# Patient Record
Sex: Female | Born: 1955
Health system: Southern US, Community
[De-identification: ages and names within clinical notes are randomized; demographics above are authoritative.]

## PROBLEM LIST (undated history)

## (undated) DIAGNOSIS — Z9889 Other specified postprocedural states: Secondary | ICD-10-CM

## (undated) DIAGNOSIS — E785 Hyperlipidemia, unspecified: Secondary | ICD-10-CM

## (undated) DIAGNOSIS — T8859XA Other complications of anesthesia, initial encounter: Secondary | ICD-10-CM

## (undated) DIAGNOSIS — E039 Hypothyroidism, unspecified: Secondary | ICD-10-CM

## (undated) DIAGNOSIS — K219 Gastro-esophageal reflux disease without esophagitis: Secondary | ICD-10-CM

## (undated) DIAGNOSIS — D242 Benign neoplasm of left breast: Secondary | ICD-10-CM

## (undated) HISTORY — PX: COLONOSCOPY: SHX174

## (undated) HISTORY — PX: LAPAROSCOPY FOR ECTOPIC PREGNANCY: SUR765

---

## 1999-08-09 ENCOUNTER — Encounter: Admission: RE | Admit: 1999-08-09 | Discharge: 1999-08-09 | Payer: Self-pay | Admitting: General Practice

## 1999-08-09 ENCOUNTER — Encounter: Payer: Self-pay | Admitting: General Practice

## 1999-08-23 ENCOUNTER — Other Ambulatory Visit: Admission: RE | Admit: 1999-08-23 | Discharge: 1999-08-23 | Payer: Self-pay | Admitting: Gynecology

## 1999-10-17 ENCOUNTER — Encounter: Admission: RE | Admit: 1999-10-17 | Discharge: 1999-10-17 | Payer: Self-pay | Admitting: Gynecology

## 1999-10-17 ENCOUNTER — Encounter: Payer: Self-pay | Admitting: Gynecology

## 1999-10-20 ENCOUNTER — Encounter: Admission: RE | Admit: 1999-10-20 | Discharge: 1999-10-20 | Payer: Self-pay | Admitting: Gynecology

## 1999-10-20 ENCOUNTER — Encounter: Payer: Self-pay | Admitting: Gynecology

## 2000-08-28 ENCOUNTER — Other Ambulatory Visit: Admission: RE | Admit: 2000-08-28 | Discharge: 2000-08-28 | Payer: Self-pay | Admitting: Gynecology

## 2000-10-21 ENCOUNTER — Encounter: Admission: RE | Admit: 2000-10-21 | Discharge: 2000-10-21 | Payer: Self-pay | Admitting: Gynecology

## 2000-10-21 ENCOUNTER — Encounter: Payer: Self-pay | Admitting: Gynecology

## 2000-10-23 ENCOUNTER — Encounter: Payer: Self-pay | Admitting: Gynecology

## 2000-10-23 ENCOUNTER — Encounter: Admission: RE | Admit: 2000-10-23 | Discharge: 2000-10-23 | Payer: Self-pay | Admitting: Gynecology

## 2001-10-15 ENCOUNTER — Other Ambulatory Visit: Admission: RE | Admit: 2001-10-15 | Discharge: 2001-10-15 | Payer: Self-pay | Admitting: Gynecology

## 2001-10-29 ENCOUNTER — Encounter: Payer: Self-pay | Admitting: Gynecology

## 2001-10-29 ENCOUNTER — Encounter: Admission: RE | Admit: 2001-10-29 | Discharge: 2001-10-29 | Payer: Self-pay | Admitting: Gynecology

## 2002-10-19 ENCOUNTER — Other Ambulatory Visit: Admission: RE | Admit: 2002-10-19 | Discharge: 2002-10-19 | Payer: Self-pay | Admitting: Gynecology

## 2002-10-30 ENCOUNTER — Encounter: Payer: Self-pay | Admitting: Gynecology

## 2002-10-30 ENCOUNTER — Encounter: Admission: RE | Admit: 2002-10-30 | Discharge: 2002-10-30 | Payer: Self-pay | Admitting: Gynecology

## 2003-12-27 ENCOUNTER — Encounter: Admission: RE | Admit: 2003-12-27 | Discharge: 2003-12-27 | Payer: Self-pay | Admitting: Gynecology

## 2004-01-05 ENCOUNTER — Other Ambulatory Visit: Admission: RE | Admit: 2004-01-05 | Discharge: 2004-01-05 | Payer: Self-pay | Admitting: Gynecology

## 2004-01-19 ENCOUNTER — Encounter: Admission: RE | Admit: 2004-01-19 | Discharge: 2004-01-19 | Payer: Self-pay | Admitting: Gynecology

## 2004-02-04 ENCOUNTER — Ambulatory Visit (HOSPITAL_COMMUNITY): Admission: RE | Admit: 2004-02-04 | Discharge: 2004-02-04 | Payer: Self-pay | Admitting: Unknown Physician Specialty

## 2004-08-28 ENCOUNTER — Ambulatory Visit (HOSPITAL_COMMUNITY): Admission: RE | Admit: 2004-08-28 | Discharge: 2004-08-28 | Payer: Self-pay | Admitting: Gastroenterology

## 2004-09-01 ENCOUNTER — Ambulatory Visit: Payer: Self-pay | Admitting: Family Medicine

## 2004-09-22 ENCOUNTER — Ambulatory Visit: Payer: Self-pay | Admitting: Family Medicine

## 2004-12-13 ENCOUNTER — Ambulatory Visit: Payer: Self-pay | Admitting: Family Medicine

## 2005-01-04 ENCOUNTER — Other Ambulatory Visit: Admission: RE | Admit: 2005-01-04 | Discharge: 2005-01-04 | Payer: Self-pay | Admitting: Gynecology

## 2005-01-09 ENCOUNTER — Ambulatory Visit (HOSPITAL_COMMUNITY): Admission: RE | Admit: 2005-01-09 | Discharge: 2005-01-09 | Payer: Self-pay | Admitting: Gynecology

## 2005-01-19 ENCOUNTER — Ambulatory Visit: Payer: Self-pay | Admitting: Family Medicine

## 2005-02-05 ENCOUNTER — Ambulatory Visit: Payer: Self-pay | Admitting: Cardiology

## 2006-06-19 ENCOUNTER — Ambulatory Visit (HOSPITAL_COMMUNITY): Admission: RE | Admit: 2006-06-19 | Discharge: 2006-06-19 | Payer: Self-pay | Admitting: Gynecology

## 2006-08-14 ENCOUNTER — Other Ambulatory Visit: Admission: RE | Admit: 2006-08-14 | Discharge: 2006-08-14 | Payer: Self-pay | Admitting: Gynecology

## 2007-07-21 ENCOUNTER — Other Ambulatory Visit: Admission: RE | Admit: 2007-07-21 | Discharge: 2007-07-21 | Payer: Self-pay | Admitting: Gynecology

## 2007-07-30 ENCOUNTER — Ambulatory Visit (HOSPITAL_COMMUNITY): Admission: RE | Admit: 2007-07-30 | Discharge: 2007-07-30 | Payer: Self-pay | Admitting: Gynecology

## 2008-06-17 ENCOUNTER — Other Ambulatory Visit: Admission: RE | Admit: 2008-06-17 | Discharge: 2008-06-17 | Payer: Self-pay | Admitting: Gynecology

## 2008-08-09 ENCOUNTER — Ambulatory Visit (HOSPITAL_COMMUNITY): Admission: RE | Admit: 2008-08-09 | Discharge: 2008-08-09 | Payer: Self-pay | Admitting: Gynecology

## 2009-07-12 ENCOUNTER — Ambulatory Visit (HOSPITAL_COMMUNITY): Admission: RE | Admit: 2009-07-12 | Discharge: 2009-07-12 | Payer: Self-pay | Admitting: Gynecology

## 2010-10-29 ENCOUNTER — Encounter: Payer: Self-pay | Admitting: Gynecology

## 2011-02-23 NOTE — Op Note (Signed)
NAMELILLYTH, [name]              ACCOUNT NO.:  [id number]   MEDICAL RECORD NO.:  [id number]          PATIENT TYPE:  AMB   LOCATION:  ENDO                         FACILITY:  [facility]   PHYSICIAN:  Anselmo Rod, M.D.  DATE OF BIRTH:  11-12-1955   DATE OF PROCEDURE:  08/28/2004  DATE OF DISCHARGE:                                 OPERATIVE REPORT   PROCEDURE PERFORMED:  Screening colonoscopy.   ENDOSCOPIST:  Charna Elizabeth, M.D.   INSTRUMENT USED:  Olympus video colonoscope.   INDICATIONS FOR PROCEDURE:  The patient is a 55 year old white female  undergoing screening colonoscopy to rule out colonic polyps, masses, etc.  The patient's mother was found to have a mass at the hepatic flexure, a  question premalignant lesion.   PREPROCEDURE PREPARATION:  Informed consent was procured from the patient.  The patient was fasted for eight hours prior to the procedure and prepped  with a bottle of magnesium citrate and a gallon of GoLYTELY the night prior  to the procedure.  The risks and benefits of the procedure including a 10%  miss rate for polyps and cancers was discussed with the patient in great  detail and the patient was fasted overnight prior to the colonoscopy.   PREPROCEDURE PHYSICAL:  The patient had stable vital signs.  Neck supple.  Chest clear to auscultation.  S1 and S2 regular.  Abdomen soft with normal  bowel sounds.   DESCRIPTION OF PROCEDURE:  The patient was placed in left lateral decubitus  position and sedated with 59 mg of Demerol and 5 mg of Versed in slow  incremental doses.  Once the patient was adequately sedated and maintained  on low flow oxygen and continuous cardiac monitoring, the Olympus video  colonoscope was advanced from the rectum to the cecum.  The appendicular  orifice and ileocecal valve were clearly visualized and photographed.  Small  internal hemorrhoids were seen on retroflexion.  No other abnormalities were  noted.  The patient tolerated the  procedure well without complications.   IMPRESSION:  Normal colonoscopy up to the cecum except for small internal  hemorrhoids seen on retroflexion.   RECOMMENDATIONS:  1.  Continue high fiber diet with liberal fluid intake.  2.  Repeat colonoscopy is recommended in the next five years unless the      patient develops any abnormal symptoms in the interim.  3.  Outpatient followup as need arises in the future.       JNM/MEDQ  D:  08/28/2004  T:  08/28/2004  Job:  [id number]   cc:   Sharlet Salina, M.D.  [address]  [place]  [place] [address]  Fax: [phone]

## 2011-10-26 ENCOUNTER — Other Ambulatory Visit: Payer: Self-pay | Admitting: Obstetrics & Gynecology

## 2014-01-21 ENCOUNTER — Other Ambulatory Visit: Payer: Self-pay | Admitting: Dermatology

## 2014-07-26 ENCOUNTER — Other Ambulatory Visit: Payer: Self-pay

## 2014-07-27 LAB — CYTOLOGY - PAP

## 2014-08-12 ENCOUNTER — Other Ambulatory Visit: Payer: Self-pay | Admitting: Gynecology

## 2014-08-12 DIAGNOSIS — R928 Other abnormal and inconclusive findings on diagnostic imaging of breast: Secondary | ICD-10-CM

## 2014-08-19 ENCOUNTER — Ambulatory Visit
Admission: RE | Admit: 2014-08-19 | Discharge: 2014-08-19 | Disposition: A | Discharge status: Home / Self Care | Payer: 59 | Source: Ambulatory Visit | Attending: Gynecology | Admitting: Gynecology

## 2014-08-19 DIAGNOSIS — R928 Other abnormal and inconclusive findings on diagnostic imaging of breast: Secondary | ICD-10-CM

## 2015-01-17 ENCOUNTER — Other Ambulatory Visit: Payer: Self-pay | Admitting: Gynecology

## 2015-01-17 DIAGNOSIS — N6001 Solitary cyst of right breast: Secondary | ICD-10-CM

## 2015-02-24 ENCOUNTER — Ambulatory Visit
Admission: RE | Admit: 2015-02-24 | Discharge: 2015-02-24 | Disposition: A | Discharge status: Home / Self Care | Payer: 59 | Source: Ambulatory Visit | Attending: Gynecology | Admitting: Gynecology

## 2015-02-24 ENCOUNTER — Other Ambulatory Visit: Payer: Self-pay

## 2015-02-24 ENCOUNTER — Encounter (INDEPENDENT_AMBULATORY_CARE_PROVIDER_SITE_OTHER): Payer: Self-pay

## 2015-02-24 DIAGNOSIS — N6001 Solitary cyst of right breast: Secondary | ICD-10-CM

## 2015-08-03 ENCOUNTER — Other Ambulatory Visit: Payer: Self-pay

## 2015-08-03 DIAGNOSIS — Z1231 Encounter for screening mammogram for malignant neoplasm of breast: Secondary | ICD-10-CM

## 2015-08-23 ENCOUNTER — Ambulatory Visit
Admission: RE | Admit: 2015-08-23 | Discharge: 2015-08-23 | Disposition: A | Discharge status: Home / Self Care | Payer: 59 | Source: Ambulatory Visit

## 2015-08-23 DIAGNOSIS — Z1231 Encounter for screening mammogram for malignant neoplasm of breast: Secondary | ICD-10-CM

## 2016-06-22 ENCOUNTER — Other Ambulatory Visit: Payer: Self-pay | Admitting: Obstetrics and Gynecology

## 2016-06-22 DIAGNOSIS — Z1231 Encounter for screening mammogram for malignant neoplasm of breast: Secondary | ICD-10-CM

## 2016-08-23 ENCOUNTER — Ambulatory Visit
Admission: RE | Admit: 2016-08-23 | Discharge: 2016-08-23 | Disposition: A | Discharge status: Home / Self Care | Payer: 59 | Source: Ambulatory Visit | Attending: Obstetrics and Gynecology | Admitting: Obstetrics and Gynecology

## 2016-08-23 DIAGNOSIS — Z1231 Encounter for screening mammogram for malignant neoplasm of breast: Secondary | ICD-10-CM

## 2016-10-30 DIAGNOSIS — E7800 Pure hypercholesterolemia, unspecified: Secondary | ICD-10-CM | POA: Diagnosis not present

## 2016-10-30 DIAGNOSIS — Z0000 Encounter for general adult medical examination without abnormal findings: Secondary | ICD-10-CM | POA: Diagnosis not present

## 2016-12-11 DIAGNOSIS — D1800 Hemangioma unspecified site: Secondary | ICD-10-CM | POA: Diagnosis not present

## 2016-12-11 DIAGNOSIS — L821 Other seborrheic keratosis: Secondary | ICD-10-CM | POA: Diagnosis not present

## 2016-12-28 DIAGNOSIS — E559 Vitamin D deficiency, unspecified: Secondary | ICD-10-CM | POA: Diagnosis not present

## 2017-01-21 DIAGNOSIS — S30871A Other superficial bite of abdominal wall, initial encounter: Secondary | ICD-10-CM | POA: Diagnosis not present

## 2017-07-24 ENCOUNTER — Other Ambulatory Visit: Payer: Self-pay | Admitting: Family Medicine

## 2017-07-24 DIAGNOSIS — Z1231 Encounter for screening mammogram for malignant neoplasm of breast: Secondary | ICD-10-CM

## 2017-08-26 ENCOUNTER — Ambulatory Visit
Admission: RE | Admit: 2017-08-26 | Discharge: 2017-08-26 | Disposition: A | Discharge status: Home / Self Care | Payer: 59 | Source: Ambulatory Visit | Attending: Family Medicine | Admitting: Family Medicine

## 2017-08-26 DIAGNOSIS — Z1231 Encounter for screening mammogram for malignant neoplasm of breast: Secondary | ICD-10-CM | POA: Diagnosis not present

## 2017-08-27 DIAGNOSIS — K219 Gastro-esophageal reflux disease without esophagitis: Secondary | ICD-10-CM | POA: Diagnosis not present

## 2017-08-27 DIAGNOSIS — K625 Hemorrhage of anus and rectum: Secondary | ICD-10-CM | POA: Diagnosis not present

## 2017-08-27 DIAGNOSIS — K602 Anal fissure, unspecified: Secondary | ICD-10-CM | POA: Diagnosis not present

## 2017-10-07 DIAGNOSIS — R300 Dysuria: Secondary | ICD-10-CM | POA: Diagnosis not present

## 2017-10-07 DIAGNOSIS — R319 Hematuria, unspecified: Secondary | ICD-10-CM | POA: Diagnosis not present

## 2017-10-07 DIAGNOSIS — N390 Urinary tract infection, site not specified: Secondary | ICD-10-CM | POA: Diagnosis not present

## 2017-10-21 DIAGNOSIS — N390 Urinary tract infection, site not specified: Secondary | ICD-10-CM | POA: Diagnosis not present

## 2017-11-01 DIAGNOSIS — R82998 Other abnormal findings in urine: Secondary | ICD-10-CM | POA: Diagnosis not present

## 2017-11-01 DIAGNOSIS — N390 Urinary tract infection, site not specified: Secondary | ICD-10-CM | POA: Diagnosis not present

## 2017-11-05 DIAGNOSIS — E7800 Pure hypercholesterolemia, unspecified: Secondary | ICD-10-CM | POA: Diagnosis not present

## 2017-11-05 DIAGNOSIS — E559 Vitamin D deficiency, unspecified: Secondary | ICD-10-CM | POA: Diagnosis not present

## 2017-11-05 DIAGNOSIS — Z0000 Encounter for general adult medical examination without abnormal findings: Secondary | ICD-10-CM | POA: Diagnosis not present

## 2017-11-26 DIAGNOSIS — N3020 Other chronic cystitis without hematuria: Secondary | ICD-10-CM | POA: Diagnosis not present

## 2017-12-02 ENCOUNTER — Ambulatory Visit
Admission: RE | Admit: 2017-12-02 | Discharge: 2017-12-02 | Disposition: A | Discharge status: Home / Self Care | Payer: 59 | Source: Ambulatory Visit | Attending: Family Medicine | Admitting: Family Medicine

## 2017-12-02 ENCOUNTER — Other Ambulatory Visit: Payer: Self-pay | Admitting: Family Medicine

## 2017-12-02 DIAGNOSIS — M25562 Pain in left knee: Principal | ICD-10-CM

## 2017-12-02 DIAGNOSIS — M25561 Pain in right knee: Secondary | ICD-10-CM

## 2017-12-02 DIAGNOSIS — M25461 Effusion, right knee: Secondary | ICD-10-CM | POA: Diagnosis not present

## 2017-12-12 DIAGNOSIS — Z800 Family history of malignant neoplasm of digestive organs: Secondary | ICD-10-CM | POA: Diagnosis not present

## 2017-12-12 DIAGNOSIS — D1800 Hemangioma unspecified site: Secondary | ICD-10-CM | POA: Diagnosis not present

## 2017-12-12 DIAGNOSIS — L821 Other seborrheic keratosis: Secondary | ICD-10-CM | POA: Diagnosis not present

## 2018-01-09 DIAGNOSIS — Z6831 Body mass index (BMI) 31.0-31.9, adult: Secondary | ICD-10-CM | POA: Diagnosis not present

## 2018-01-09 DIAGNOSIS — Z01419 Encounter for gynecological examination (general) (routine) without abnormal findings: Secondary | ICD-10-CM | POA: Diagnosis not present

## 2018-01-15 DIAGNOSIS — R9389 Abnormal findings on diagnostic imaging of other specified body structures: Secondary | ICD-10-CM | POA: Diagnosis not present

## 2018-01-15 DIAGNOSIS — N950 Postmenopausal bleeding: Secondary | ICD-10-CM | POA: Diagnosis not present

## 2018-02-27 DIAGNOSIS — N840 Polyp of corpus uteri: Secondary | ICD-10-CM | POA: Diagnosis not present

## 2018-02-27 DIAGNOSIS — N950 Postmenopausal bleeding: Secondary | ICD-10-CM | POA: Diagnosis not present

## 2018-02-27 DIAGNOSIS — N858 Other specified noninflammatory disorders of uterus: Secondary | ICD-10-CM | POA: Diagnosis not present

## 2018-04-18 DIAGNOSIS — R5382 Chronic fatigue, unspecified: Secondary | ICD-10-CM | POA: Diagnosis not present

## 2018-04-18 DIAGNOSIS — R05 Cough: Secondary | ICD-10-CM | POA: Diagnosis not present

## 2018-04-18 DIAGNOSIS — E559 Vitamin D deficiency, unspecified: Secondary | ICD-10-CM | POA: Diagnosis not present

## 2018-04-18 DIAGNOSIS — B029 Zoster without complications: Secondary | ICD-10-CM | POA: Diagnosis not present

## 2018-04-18 DIAGNOSIS — E538 Deficiency of other specified B group vitamins: Secondary | ICD-10-CM | POA: Diagnosis not present

## 2018-04-22 ENCOUNTER — Ambulatory Visit
Admission: RE | Admit: 2018-04-22 | Discharge: 2018-04-22 | Disposition: A | Discharge status: Home / Self Care | Payer: 59 | Source: Ambulatory Visit | Attending: Family Medicine | Admitting: Family Medicine

## 2018-04-22 ENCOUNTER — Other Ambulatory Visit: Payer: Self-pay | Admitting: Family Medicine

## 2018-04-22 DIAGNOSIS — R05 Cough: Secondary | ICD-10-CM

## 2018-04-22 DIAGNOSIS — R059 Cough, unspecified: Secondary | ICD-10-CM

## 2018-04-22 DIAGNOSIS — R109 Unspecified abdominal pain: Secondary | ICD-10-CM | POA: Diagnosis not present

## 2018-06-18 DIAGNOSIS — M79671 Pain in right foot: Secondary | ICD-10-CM | POA: Diagnosis not present

## 2018-06-18 DIAGNOSIS — Z23 Encounter for immunization: Secondary | ICD-10-CM | POA: Diagnosis not present

## 2018-06-18 DIAGNOSIS — E039 Hypothyroidism, unspecified: Secondary | ICD-10-CM | POA: Diagnosis not present

## 2018-06-19 ENCOUNTER — Other Ambulatory Visit: Payer: Self-pay | Admitting: Family Medicine

## 2018-06-19 ENCOUNTER — Ambulatory Visit
Admission: RE | Admit: 2018-06-19 | Discharge: 2018-06-19 | Disposition: A | Discharge status: Home / Self Care | Payer: 59 | Source: Ambulatory Visit | Attending: Family Medicine | Admitting: Family Medicine

## 2018-06-19 DIAGNOSIS — M79671 Pain in right foot: Secondary | ICD-10-CM

## 2018-06-19 DIAGNOSIS — S99921A Unspecified injury of right foot, initial encounter: Secondary | ICD-10-CM | POA: Diagnosis not present

## 2018-08-27 ENCOUNTER — Other Ambulatory Visit: Payer: Self-pay | Admitting: Family Medicine

## 2018-08-27 DIAGNOSIS — Z1231 Encounter for screening mammogram for malignant neoplasm of breast: Secondary | ICD-10-CM

## 2018-09-09 ENCOUNTER — Ambulatory Visit
Admission: RE | Admit: 2018-09-09 | Discharge: 2018-09-09 | Disposition: A | Discharge status: Home / Self Care | Payer: 59 | Source: Ambulatory Visit | Attending: Family Medicine | Admitting: Family Medicine

## 2018-09-09 DIAGNOSIS — Z1231 Encounter for screening mammogram for malignant neoplasm of breast: Secondary | ICD-10-CM | POA: Diagnosis not present

## 2018-11-25 DIAGNOSIS — E7800 Pure hypercholesterolemia, unspecified: Secondary | ICD-10-CM | POA: Diagnosis not present

## 2018-11-25 DIAGNOSIS — Z5181 Encounter for therapeutic drug level monitoring: Secondary | ICD-10-CM | POA: Diagnosis not present

## 2018-11-25 DIAGNOSIS — E559 Vitamin D deficiency, unspecified: Secondary | ICD-10-CM | POA: Diagnosis not present

## 2018-11-25 DIAGNOSIS — E039 Hypothyroidism, unspecified: Secondary | ICD-10-CM | POA: Diagnosis not present

## 2018-12-05 DIAGNOSIS — J069 Acute upper respiratory infection, unspecified: Secondary | ICD-10-CM | POA: Diagnosis not present

## 2018-12-10 DIAGNOSIS — Z0000 Encounter for general adult medical examination without abnormal findings: Secondary | ICD-10-CM | POA: Diagnosis not present

## 2018-12-22 DIAGNOSIS — Z1211 Encounter for screening for malignant neoplasm of colon: Secondary | ICD-10-CM | POA: Diagnosis not present

## 2019-08-31 DIAGNOSIS — L0231 Cutaneous abscess of buttock: Secondary | ICD-10-CM | POA: Diagnosis not present

## 2019-09-07 DIAGNOSIS — L723 Sebaceous cyst: Secondary | ICD-10-CM | POA: Diagnosis not present

## 2019-11-03 ENCOUNTER — Other Ambulatory Visit: Payer: Self-pay | Admitting: Family Medicine

## 2019-11-03 DIAGNOSIS — Z1231 Encounter for screening mammogram for malignant neoplasm of breast: Secondary | ICD-10-CM

## 2019-11-09 ENCOUNTER — Other Ambulatory Visit: Payer: 59

## 2019-12-09 ENCOUNTER — Ambulatory Visit: Payer: 59

## 2019-12-28 ENCOUNTER — Other Ambulatory Visit: Payer: Self-pay

## 2019-12-28 ENCOUNTER — Ambulatory Visit
Admission: RE | Admit: 2019-12-28 | Discharge: 2019-12-28 | Disposition: A | Discharge status: Home / Self Care | Payer: Medicare HMO | Source: Ambulatory Visit | Attending: Family Medicine | Admitting: Family Medicine

## 2019-12-28 DIAGNOSIS — Z1231 Encounter for screening mammogram for malignant neoplasm of breast: Secondary | ICD-10-CM

## 2019-12-30 ENCOUNTER — Other Ambulatory Visit: Payer: Self-pay | Admitting: Family Medicine

## 2019-12-30 DIAGNOSIS — R928 Other abnormal and inconclusive findings on diagnostic imaging of breast: Secondary | ICD-10-CM

## 2020-01-01 ENCOUNTER — Other Ambulatory Visit: Payer: Self-pay

## 2020-01-01 ENCOUNTER — Ambulatory Visit
Admission: RE | Admit: 2020-01-01 | Discharge: 2020-01-01 | Disposition: A | Discharge status: Home / Self Care | Payer: Medicare HMO | Source: Ambulatory Visit | Attending: Family Medicine | Admitting: Family Medicine

## 2020-01-01 DIAGNOSIS — N6324 Unspecified lump in the left breast, lower inner quadrant: Secondary | ICD-10-CM | POA: Diagnosis not present

## 2020-01-01 DIAGNOSIS — R928 Other abnormal and inconclusive findings on diagnostic imaging of breast: Secondary | ICD-10-CM | POA: Diagnosis not present

## 2020-01-20 DIAGNOSIS — E7800 Pure hypercholesterolemia, unspecified: Secondary | ICD-10-CM | POA: Diagnosis not present

## 2020-01-20 DIAGNOSIS — E039 Hypothyroidism, unspecified: Secondary | ICD-10-CM | POA: Diagnosis not present

## 2020-01-20 DIAGNOSIS — Z5181 Encounter for therapeutic drug level monitoring: Secondary | ICD-10-CM | POA: Diagnosis not present

## 2020-01-27 DIAGNOSIS — Z0000 Encounter for general adult medical examination without abnormal findings: Secondary | ICD-10-CM | POA: Diagnosis not present

## 2020-01-27 DIAGNOSIS — E039 Hypothyroidism, unspecified: Secondary | ICD-10-CM | POA: Diagnosis not present

## 2020-01-27 DIAGNOSIS — E7800 Pure hypercholesterolemia, unspecified: Secondary | ICD-10-CM | POA: Diagnosis not present

## 2020-01-27 DIAGNOSIS — Z5181 Encounter for therapeutic drug level monitoring: Secondary | ICD-10-CM | POA: Diagnosis not present

## 2020-01-27 DIAGNOSIS — M1990 Unspecified osteoarthritis, unspecified site: Secondary | ICD-10-CM | POA: Diagnosis not present

## 2020-01-27 DIAGNOSIS — Z23 Encounter for immunization: Secondary | ICD-10-CM | POA: Diagnosis not present

## 2020-02-16 DIAGNOSIS — M549 Dorsalgia, unspecified: Secondary | ICD-10-CM | POA: Diagnosis not present

## 2020-02-16 DIAGNOSIS — M545 Low back pain: Secondary | ICD-10-CM | POA: Diagnosis not present

## 2020-02-16 DIAGNOSIS — D8989 Other specified disorders involving the immune mechanism, not elsewhere classified: Secondary | ICD-10-CM | POA: Diagnosis not present

## 2020-02-16 DIAGNOSIS — R768 Other specified abnormal immunological findings in serum: Secondary | ICD-10-CM | POA: Diagnosis not present

## 2020-02-16 DIAGNOSIS — M79643 Pain in unspecified hand: Secondary | ICD-10-CM | POA: Diagnosis not present

## 2020-02-16 DIAGNOSIS — E785 Hyperlipidemia, unspecified: Secondary | ICD-10-CM | POA: Diagnosis not present

## 2020-02-16 DIAGNOSIS — M359 Systemic involvement of connective tissue, unspecified: Secondary | ICD-10-CM | POA: Diagnosis not present

## 2020-02-16 DIAGNOSIS — M79673 Pain in unspecified foot: Secondary | ICD-10-CM | POA: Diagnosis not present

## 2020-02-16 DIAGNOSIS — E039 Hypothyroidism, unspecified: Secondary | ICD-10-CM | POA: Diagnosis not present

## 2020-02-16 DIAGNOSIS — M1990 Unspecified osteoarthritis, unspecified site: Secondary | ICD-10-CM | POA: Diagnosis not present

## 2020-03-10 DIAGNOSIS — J0100 Acute maxillary sinusitis, unspecified: Secondary | ICD-10-CM | POA: Diagnosis not present

## 2020-03-10 DIAGNOSIS — R05 Cough: Secondary | ICD-10-CM | POA: Diagnosis not present

## 2020-04-18 DIAGNOSIS — E039 Hypothyroidism, unspecified: Secondary | ICD-10-CM | POA: Diagnosis not present

## 2020-05-16 DIAGNOSIS — Z7189 Other specified counseling: Secondary | ICD-10-CM | POA: Diagnosis not present

## 2020-05-16 DIAGNOSIS — E063 Autoimmune thyroiditis: Secondary | ICD-10-CM | POA: Diagnosis not present

## 2020-05-16 DIAGNOSIS — E039 Hypothyroidism, unspecified: Secondary | ICD-10-CM | POA: Diagnosis not present

## 2020-06-28 DIAGNOSIS — E039 Hypothyroidism, unspecified: Secondary | ICD-10-CM | POA: Diagnosis not present

## 2020-06-28 DIAGNOSIS — Z23 Encounter for immunization: Secondary | ICD-10-CM | POA: Diagnosis not present

## 2020-07-07 DIAGNOSIS — B009 Herpesviral infection, unspecified: Secondary | ICD-10-CM | POA: Diagnosis not present

## 2020-07-07 DIAGNOSIS — Z6831 Body mass index (BMI) 31.0-31.9, adult: Secondary | ICD-10-CM | POA: Diagnosis not present

## 2020-07-07 DIAGNOSIS — Z01419 Encounter for gynecological examination (general) (routine) without abnormal findings: Secondary | ICD-10-CM | POA: Diagnosis not present

## 2020-07-07 DIAGNOSIS — L292 Pruritus vulvae: Secondary | ICD-10-CM | POA: Diagnosis not present

## 2020-07-13 DIAGNOSIS — R309 Painful micturition, unspecified: Secondary | ICD-10-CM | POA: Diagnosis not present

## 2020-07-13 DIAGNOSIS — N390 Urinary tract infection, site not specified: Secondary | ICD-10-CM | POA: Diagnosis not present

## 2020-08-08 DIAGNOSIS — Z03818 Encounter for observation for suspected exposure to other biological agents ruled out: Secondary | ICD-10-CM | POA: Diagnosis not present

## 2020-08-08 DIAGNOSIS — R0981 Nasal congestion: Secondary | ICD-10-CM | POA: Diagnosis not present

## 2020-08-22 DIAGNOSIS — E039 Hypothyroidism, unspecified: Secondary | ICD-10-CM | POA: Diagnosis not present

## 2020-08-22 DIAGNOSIS — R682 Dry mouth, unspecified: Secondary | ICD-10-CM | POA: Diagnosis not present

## 2020-08-22 DIAGNOSIS — E063 Autoimmune thyroiditis: Secondary | ICD-10-CM | POA: Diagnosis not present

## 2020-11-08 DIAGNOSIS — H43811 Vitreous degeneration, right eye: Secondary | ICD-10-CM | POA: Diagnosis not present

## 2020-11-25 DIAGNOSIS — H524 Presbyopia: Secondary | ICD-10-CM | POA: Diagnosis not present

## 2021-01-10 DIAGNOSIS — L578 Other skin changes due to chronic exposure to nonionizing radiation: Secondary | ICD-10-CM | POA: Diagnosis not present

## 2021-01-10 DIAGNOSIS — L300 Nummular dermatitis: Secondary | ICD-10-CM | POA: Diagnosis not present

## 2021-01-10 DIAGNOSIS — L723 Sebaceous cyst: Secondary | ICD-10-CM | POA: Diagnosis not present

## 2021-01-10 DIAGNOSIS — L821 Other seborrheic keratosis: Secondary | ICD-10-CM | POA: Diagnosis not present

## 2021-01-10 DIAGNOSIS — L570 Actinic keratosis: Secondary | ICD-10-CM | POA: Diagnosis not present

## 2021-01-11 ENCOUNTER — Other Ambulatory Visit: Payer: Self-pay | Admitting: Family Medicine

## 2021-01-11 DIAGNOSIS — Z1231 Encounter for screening mammogram for malignant neoplasm of breast: Secondary | ICD-10-CM

## 2021-01-18 DIAGNOSIS — M5033 Other cervical disc degeneration, cervicothoracic region: Secondary | ICD-10-CM | POA: Diagnosis not present

## 2021-01-18 DIAGNOSIS — M9901 Segmental and somatic dysfunction of cervical region: Secondary | ICD-10-CM | POA: Diagnosis not present

## 2021-01-19 DIAGNOSIS — M9901 Segmental and somatic dysfunction of cervical region: Secondary | ICD-10-CM | POA: Diagnosis not present

## 2021-01-19 DIAGNOSIS — M5033 Other cervical disc degeneration, cervicothoracic region: Secondary | ICD-10-CM | POA: Diagnosis not present

## 2021-01-23 DIAGNOSIS — M9901 Segmental and somatic dysfunction of cervical region: Secondary | ICD-10-CM | POA: Diagnosis not present

## 2021-01-23 DIAGNOSIS — M5033 Other cervical disc degeneration, cervicothoracic region: Secondary | ICD-10-CM | POA: Diagnosis not present

## 2021-01-24 DIAGNOSIS — M9901 Segmental and somatic dysfunction of cervical region: Secondary | ICD-10-CM | POA: Diagnosis not present

## 2021-01-24 DIAGNOSIS — M5033 Other cervical disc degeneration, cervicothoracic region: Secondary | ICD-10-CM | POA: Diagnosis not present

## 2021-01-25 DIAGNOSIS — E7800 Pure hypercholesterolemia, unspecified: Secondary | ICD-10-CM | POA: Diagnosis not present

## 2021-01-25 DIAGNOSIS — Z5181 Encounter for therapeutic drug level monitoring: Secondary | ICD-10-CM | POA: Diagnosis not present

## 2021-01-30 DIAGNOSIS — E039 Hypothyroidism, unspecified: Secondary | ICD-10-CM | POA: Diagnosis not present

## 2021-01-30 DIAGNOSIS — K219 Gastro-esophageal reflux disease without esophagitis: Secondary | ICD-10-CM | POA: Diagnosis not present

## 2021-01-30 DIAGNOSIS — E7800 Pure hypercholesterolemia, unspecified: Secondary | ICD-10-CM | POA: Diagnosis not present

## 2021-01-30 DIAGNOSIS — Z0000 Encounter for general adult medical examination without abnormal findings: Secondary | ICD-10-CM | POA: Diagnosis not present

## 2021-01-31 DIAGNOSIS — M9901 Segmental and somatic dysfunction of cervical region: Secondary | ICD-10-CM | POA: Diagnosis not present

## 2021-01-31 DIAGNOSIS — M5033 Other cervical disc degeneration, cervicothoracic region: Secondary | ICD-10-CM | POA: Diagnosis not present

## 2021-02-01 DIAGNOSIS — M5033 Other cervical disc degeneration, cervicothoracic region: Secondary | ICD-10-CM | POA: Diagnosis not present

## 2021-02-01 DIAGNOSIS — M9901 Segmental and somatic dysfunction of cervical region: Secondary | ICD-10-CM | POA: Diagnosis not present

## 2021-02-02 DIAGNOSIS — M9901 Segmental and somatic dysfunction of cervical region: Secondary | ICD-10-CM | POA: Diagnosis not present

## 2021-02-02 DIAGNOSIS — M5033 Other cervical disc degeneration, cervicothoracic region: Secondary | ICD-10-CM | POA: Diagnosis not present

## 2021-02-06 DIAGNOSIS — M9901 Segmental and somatic dysfunction of cervical region: Secondary | ICD-10-CM | POA: Diagnosis not present

## 2021-02-06 DIAGNOSIS — M5033 Other cervical disc degeneration, cervicothoracic region: Secondary | ICD-10-CM | POA: Diagnosis not present

## 2021-02-07 DIAGNOSIS — M5033 Other cervical disc degeneration, cervicothoracic region: Secondary | ICD-10-CM | POA: Diagnosis not present

## 2021-02-07 DIAGNOSIS — M9901 Segmental and somatic dysfunction of cervical region: Secondary | ICD-10-CM | POA: Diagnosis not present

## 2021-02-09 DIAGNOSIS — M9901 Segmental and somatic dysfunction of cervical region: Secondary | ICD-10-CM | POA: Diagnosis not present

## 2021-02-09 DIAGNOSIS — M5033 Other cervical disc degeneration, cervicothoracic region: Secondary | ICD-10-CM | POA: Diagnosis not present

## 2021-02-13 DIAGNOSIS — M5033 Other cervical disc degeneration, cervicothoracic region: Secondary | ICD-10-CM | POA: Diagnosis not present

## 2021-02-13 DIAGNOSIS — M9901 Segmental and somatic dysfunction of cervical region: Secondary | ICD-10-CM | POA: Diagnosis not present

## 2021-02-14 DIAGNOSIS — M9901 Segmental and somatic dysfunction of cervical region: Secondary | ICD-10-CM | POA: Diagnosis not present

## 2021-02-14 DIAGNOSIS — M5033 Other cervical disc degeneration, cervicothoracic region: Secondary | ICD-10-CM | POA: Diagnosis not present

## 2021-02-17 DIAGNOSIS — M2021 Hallux rigidus, right foot: Secondary | ICD-10-CM | POA: Diagnosis not present

## 2021-03-07 ENCOUNTER — Ambulatory Visit: Payer: Medicare HMO

## 2021-04-26 ENCOUNTER — Other Ambulatory Visit: Payer: Self-pay

## 2021-04-26 ENCOUNTER — Ambulatory Visit
Admission: RE | Admit: 2021-04-26 | Discharge: 2021-04-26 | Disposition: A | Discharge status: Home / Self Care | Payer: Medicare HMO | Source: Ambulatory Visit | Attending: Family Medicine | Admitting: Family Medicine

## 2021-04-26 DIAGNOSIS — Z1231 Encounter for screening mammogram for malignant neoplasm of breast: Secondary | ICD-10-CM

## 2021-06-15 DIAGNOSIS — E039 Hypothyroidism, unspecified: Secondary | ICD-10-CM | POA: Diagnosis not present

## 2021-06-22 DIAGNOSIS — K219 Gastro-esophageal reflux disease without esophagitis: Secondary | ICD-10-CM | POA: Diagnosis not present

## 2021-06-22 DIAGNOSIS — Z1211 Encounter for screening for malignant neoplasm of colon: Secondary | ICD-10-CM | POA: Diagnosis not present

## 2021-06-22 DIAGNOSIS — K5730 Diverticulosis of large intestine without perforation or abscess without bleeding: Secondary | ICD-10-CM | POA: Diagnosis not present

## 2021-06-22 DIAGNOSIS — E669 Obesity, unspecified: Secondary | ICD-10-CM | POA: Diagnosis not present

## 2021-06-29 DIAGNOSIS — E063 Autoimmune thyroiditis: Secondary | ICD-10-CM | POA: Diagnosis not present

## 2021-06-29 DIAGNOSIS — E039 Hypothyroidism, unspecified: Secondary | ICD-10-CM | POA: Diagnosis not present

## 2021-06-29 DIAGNOSIS — H9319 Tinnitus, unspecified ear: Secondary | ICD-10-CM | POA: Diagnosis not present

## 2021-07-04 DIAGNOSIS — Z23 Encounter for immunization: Secondary | ICD-10-CM | POA: Diagnosis not present

## 2021-07-25 DIAGNOSIS — M8588 Other specified disorders of bone density and structure, other site: Secondary | ICD-10-CM | POA: Diagnosis not present

## 2021-07-25 DIAGNOSIS — M12851 Other specific arthropathies, not elsewhere classified, right hip: Secondary | ICD-10-CM | POA: Diagnosis not present

## 2021-07-25 DIAGNOSIS — Z6830 Body mass index (BMI) 30.0-30.9, adult: Secondary | ICD-10-CM | POA: Diagnosis not present

## 2021-07-25 DIAGNOSIS — Z124 Encounter for screening for malignant neoplasm of cervix: Secondary | ICD-10-CM | POA: Diagnosis not present

## 2021-07-25 DIAGNOSIS — N958 Other specified menopausal and perimenopausal disorders: Secondary | ICD-10-CM | POA: Diagnosis not present

## 2021-08-01 DIAGNOSIS — M8580 Other specified disorders of bone density and structure, unspecified site: Secondary | ICD-10-CM | POA: Diagnosis not present

## 2021-08-01 DIAGNOSIS — E559 Vitamin D deficiency, unspecified: Secondary | ICD-10-CM | POA: Diagnosis not present

## 2021-08-04 IMAGING — MG MM DIGITAL SCREENING BILAT W/ TOMO AND CAD
8 series · 8 of 24 positions shown · non-contrast
Comparison: Previous exam(s).

CLINICAL DATA: Screening.

EXAM:
DIGITAL SCREENING BILATERAL MAMMOGRAM WITH TOMOSYNTHESIS AND CAD
TECHNIQUE: Bilateral screening digital craniocaudal and mediolateral oblique
mammograms were obtained. Bilateral screening digital breast
tomosynthesis was performed. The images were evaluated with
computer-aided detection.

[R MLO synth-2D]
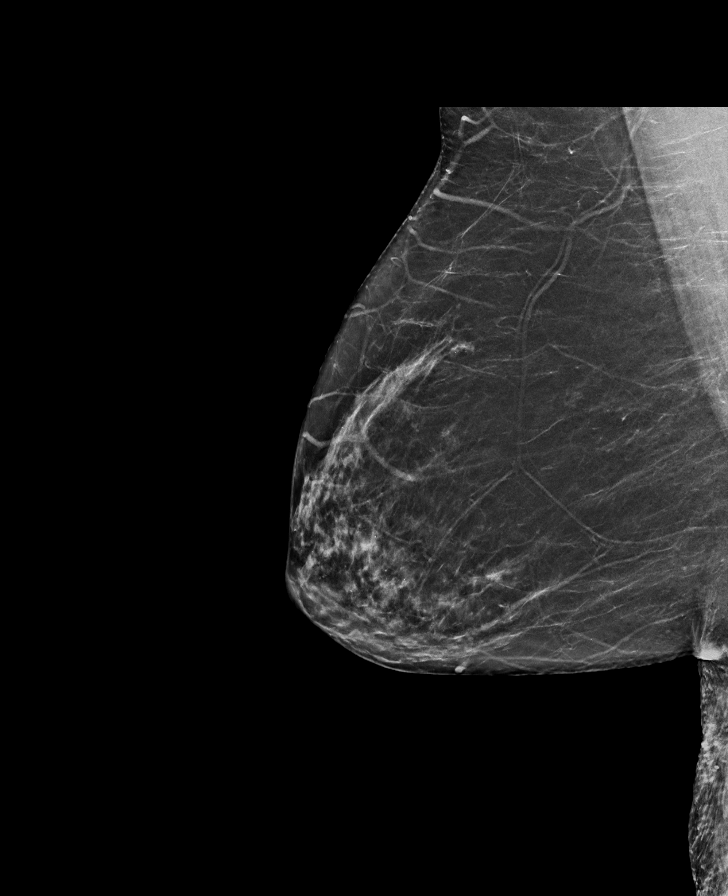

[L CC synth-2D]
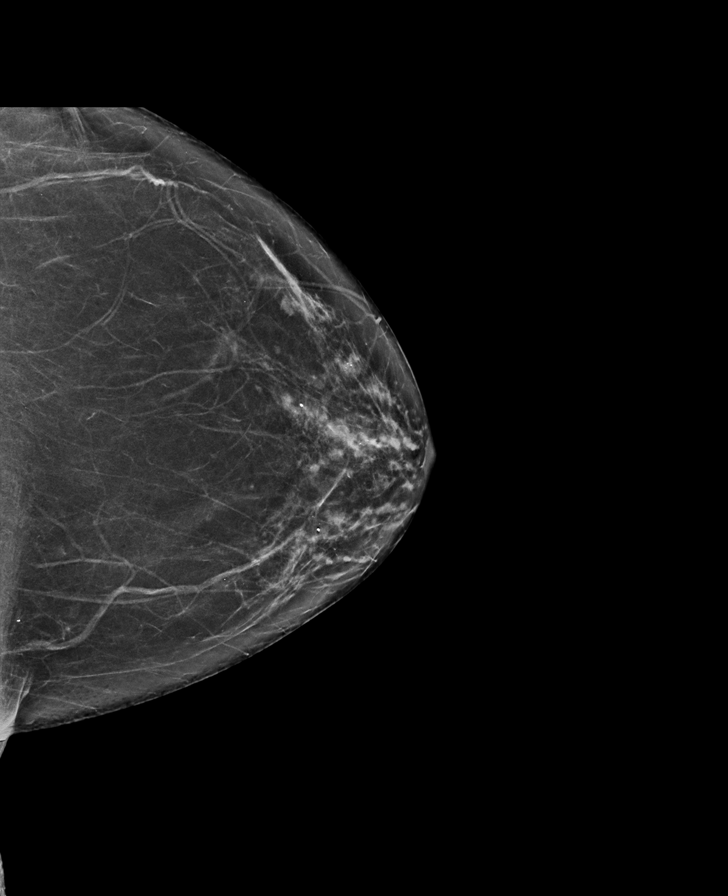

[L MLO synth-2D]
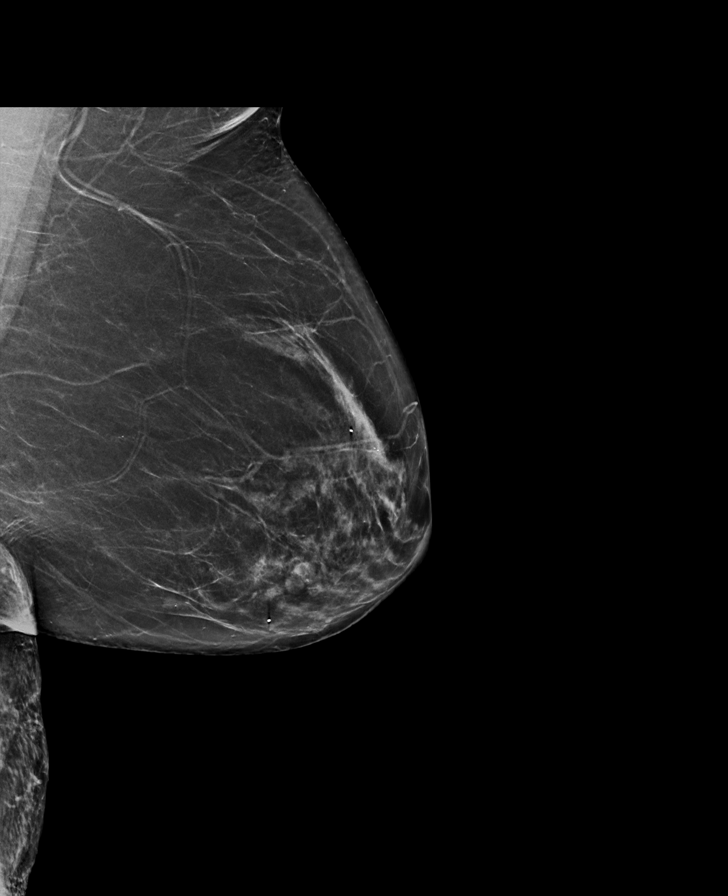

[R CC synth-2D]
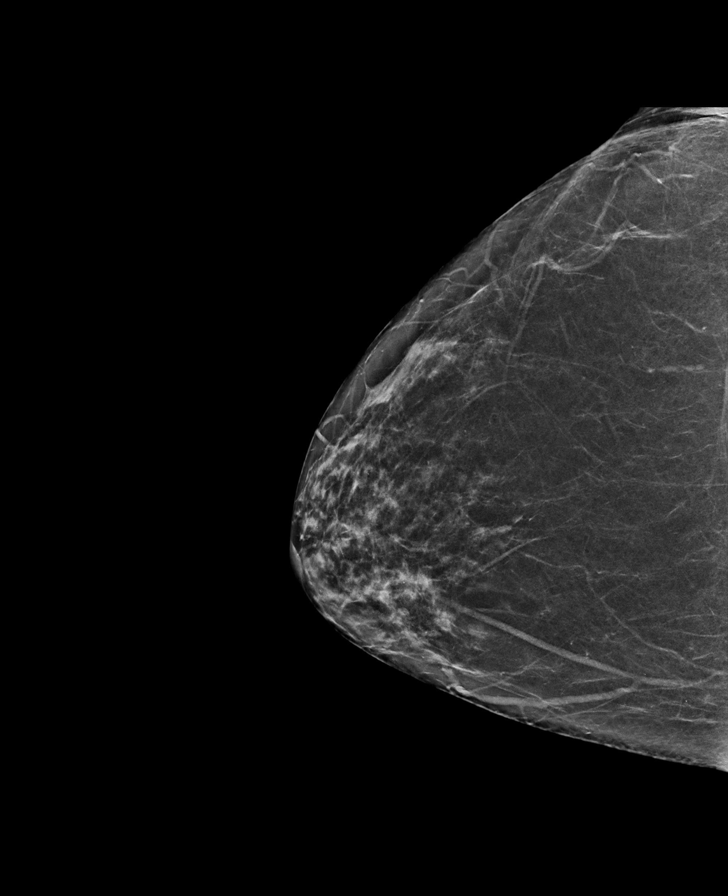

[R CC tomo · tomo slice 31/62.0]
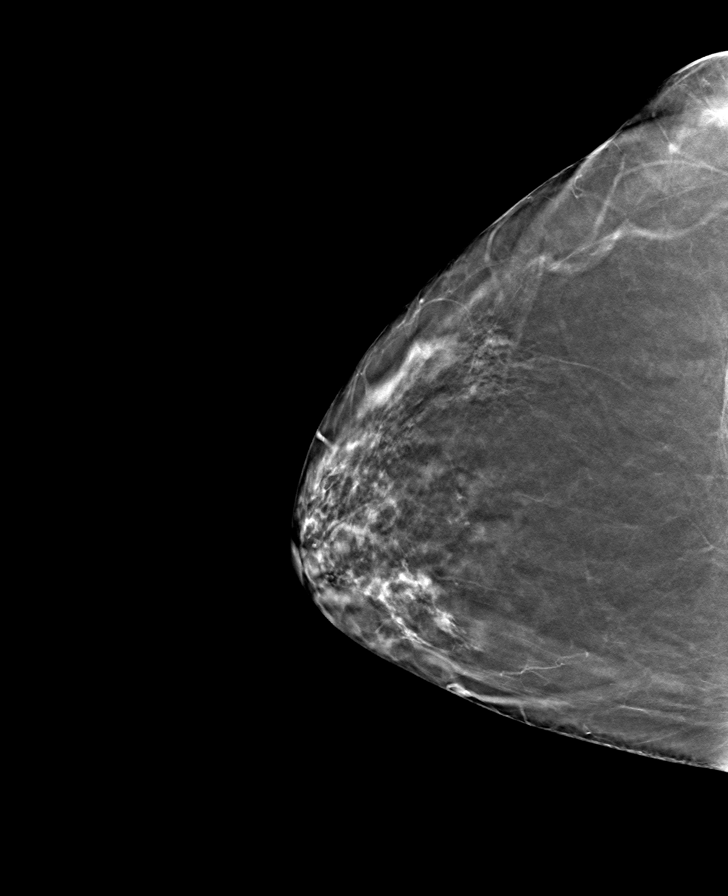

[L MLO tomo · tomo slice 35/69.0]
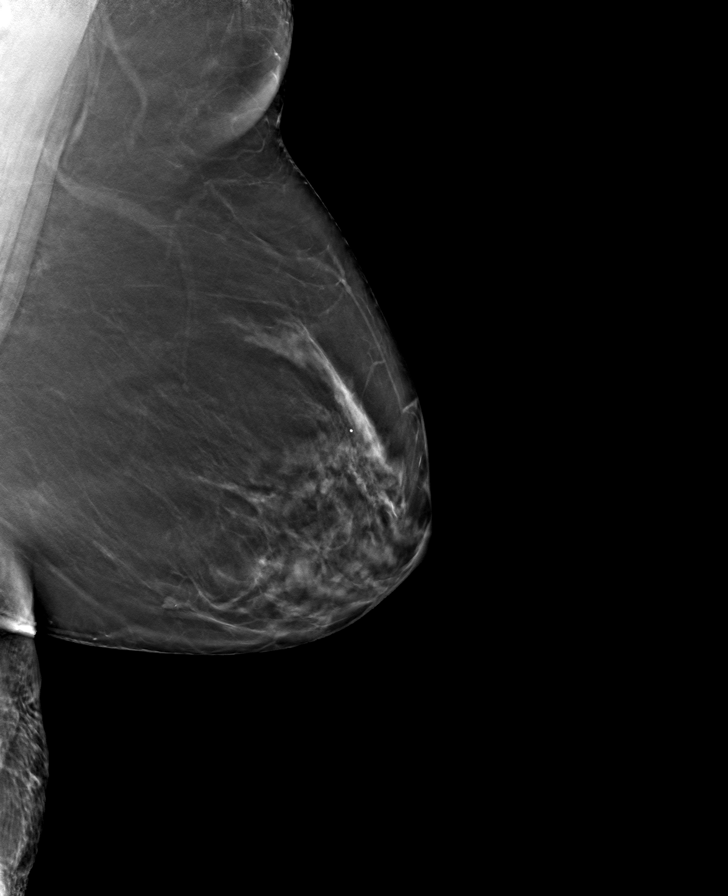

[L CC tomo · tomo slice 32/63.0]
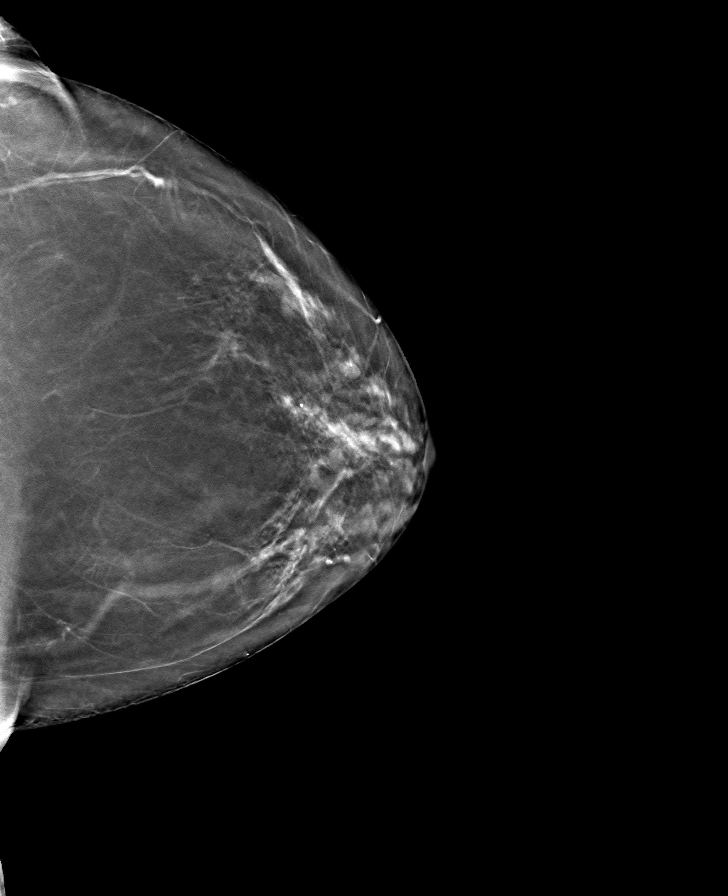

[R MLO tomo · tomo slice 33/64.0]
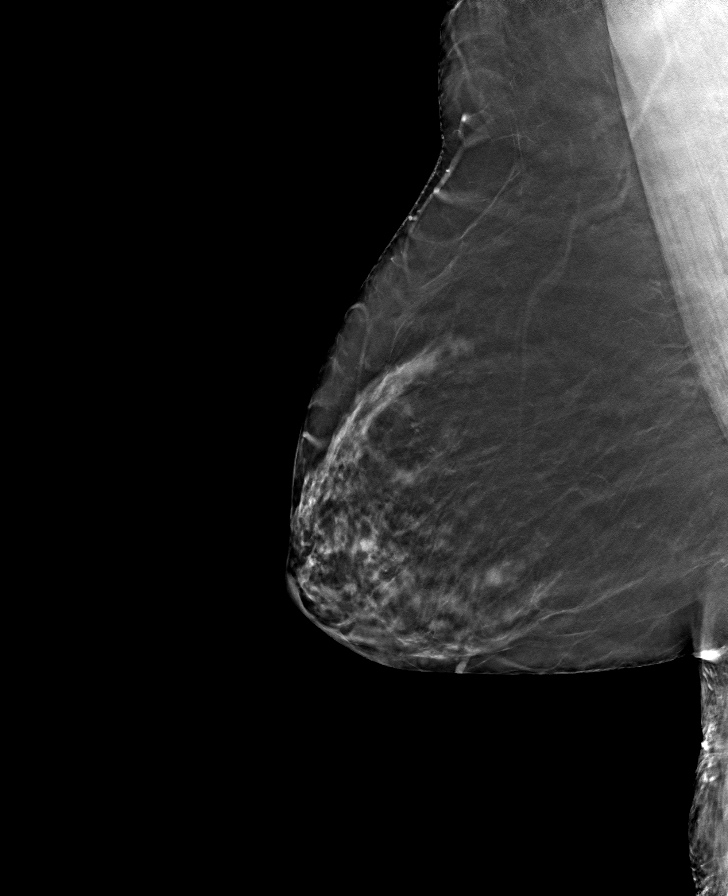

[8 of 24 positions shown; findings below may reference images not displayed]

ACR Breast Density Category b: There are scattered areas of
fibroglandular density.
FINDINGS: There are no findings suspicious for malignancy.
IMPRESSION: No mammographic evidence of malignancy. A result letter of this
screening mammogram will be mailed directly to the patient.

RECOMMENDATION:
Screening mammogram in one year. (Code:51-O-LD2)

BI-RADS CATEGORY  1: Negative.

## 2021-08-18 DIAGNOSIS — K635 Polyp of colon: Secondary | ICD-10-CM | POA: Diagnosis not present

## 2021-08-18 DIAGNOSIS — Z1211 Encounter for screening for malignant neoplasm of colon: Secondary | ICD-10-CM | POA: Diagnosis not present

## 2021-08-18 DIAGNOSIS — D120 Benign neoplasm of cecum: Secondary | ICD-10-CM | POA: Diagnosis not present

## 2021-11-02 DIAGNOSIS — E559 Vitamin D deficiency, unspecified: Secondary | ICD-10-CM | POA: Diagnosis not present

## 2021-11-27 DIAGNOSIS — H2513 Age-related nuclear cataract, bilateral: Secondary | ICD-10-CM | POA: Diagnosis not present

## 2022-01-24 DIAGNOSIS — L723 Sebaceous cyst: Secondary | ICD-10-CM | POA: Diagnosis not present

## 2022-01-24 DIAGNOSIS — L821 Other seborrheic keratosis: Secondary | ICD-10-CM | POA: Diagnosis not present

## 2022-01-24 DIAGNOSIS — L570 Actinic keratosis: Secondary | ICD-10-CM | POA: Diagnosis not present

## 2022-01-24 DIAGNOSIS — Z411 Encounter for cosmetic surgery: Secondary | ICD-10-CM | POA: Diagnosis not present

## 2022-01-24 DIAGNOSIS — L820 Inflamed seborrheic keratosis: Secondary | ICD-10-CM | POA: Diagnosis not present

## 2022-01-24 DIAGNOSIS — L578 Other skin changes due to chronic exposure to nonionizing radiation: Secondary | ICD-10-CM | POA: Diagnosis not present

## 2022-04-04 ENCOUNTER — Other Ambulatory Visit: Payer: Self-pay | Admitting: Obstetrics and Gynecology

## 2022-04-04 DIAGNOSIS — Z1231 Encounter for screening mammogram for malignant neoplasm of breast: Secondary | ICD-10-CM

## 2022-04-25 DIAGNOSIS — E559 Vitamin D deficiency, unspecified: Secondary | ICD-10-CM | POA: Diagnosis not present

## 2022-04-25 DIAGNOSIS — Z23 Encounter for immunization: Secondary | ICD-10-CM | POA: Diagnosis not present

## 2022-04-25 DIAGNOSIS — E7800 Pure hypercholesterolemia, unspecified: Secondary | ICD-10-CM | POA: Diagnosis not present

## 2022-04-25 DIAGNOSIS — M8580 Other specified disorders of bone density and structure, unspecified site: Secondary | ICD-10-CM | POA: Diagnosis not present

## 2022-04-25 DIAGNOSIS — Z0000 Encounter for general adult medical examination without abnormal findings: Secondary | ICD-10-CM | POA: Diagnosis not present

## 2022-04-25 DIAGNOSIS — K219 Gastro-esophageal reflux disease without esophagitis: Secondary | ICD-10-CM | POA: Diagnosis not present

## 2022-04-25 DIAGNOSIS — E039 Hypothyroidism, unspecified: Secondary | ICD-10-CM | POA: Diagnosis not present

## 2022-05-02 ENCOUNTER — Ambulatory Visit
Admission: RE | Admit: 2022-05-02 | Discharge: 2022-05-02 | Disposition: A | Discharge status: Home / Self Care | Payer: Medicare HMO | Source: Ambulatory Visit | Attending: Obstetrics and Gynecology | Admitting: Obstetrics and Gynecology

## 2022-05-02 DIAGNOSIS — Z1231 Encounter for screening mammogram for malignant neoplasm of breast: Secondary | ICD-10-CM | POA: Diagnosis not present

## 2022-05-04 ENCOUNTER — Other Ambulatory Visit: Payer: Self-pay | Admitting: Obstetrics and Gynecology

## 2022-05-04 DIAGNOSIS — R928 Other abnormal and inconclusive findings on diagnostic imaging of breast: Secondary | ICD-10-CM

## 2022-05-14 ENCOUNTER — Ambulatory Visit
Admission: RE | Admit: 2022-05-14 | Discharge: 2022-05-14 | Disposition: A | Discharge status: Home / Self Care | Payer: Medicare HMO | Source: Ambulatory Visit | Attending: Obstetrics and Gynecology | Admitting: Obstetrics and Gynecology

## 2022-05-14 ENCOUNTER — Other Ambulatory Visit: Payer: Self-pay | Admitting: Obstetrics and Gynecology

## 2022-05-14 DIAGNOSIS — R922 Inconclusive mammogram: Secondary | ICD-10-CM | POA: Diagnosis not present

## 2022-05-14 DIAGNOSIS — R928 Other abnormal and inconclusive findings on diagnostic imaging of breast: Secondary | ICD-10-CM

## 2022-05-14 DIAGNOSIS — N6320 Unspecified lump in the left breast, unspecified quadrant: Secondary | ICD-10-CM

## 2022-05-14 DIAGNOSIS — N6321 Unspecified lump in the left breast, upper outer quadrant: Secondary | ICD-10-CM | POA: Diagnosis not present

## 2022-05-15 ENCOUNTER — Other Ambulatory Visit: Payer: Self-pay | Admitting: Diagnostic Radiology

## 2022-05-15 ENCOUNTER — Ambulatory Visit
Admission: RE | Admit: 2022-05-15 | Discharge: 2022-05-15 | Disposition: A | Discharge status: Home / Self Care | Payer: Medicare HMO | Source: Ambulatory Visit | Attending: Obstetrics and Gynecology | Admitting: Obstetrics and Gynecology

## 2022-05-15 DIAGNOSIS — N6321 Unspecified lump in the left breast, upper outer quadrant: Secondary | ICD-10-CM | POA: Diagnosis not present

## 2022-05-15 DIAGNOSIS — N6320 Unspecified lump in the left breast, unspecified quadrant: Secondary | ICD-10-CM

## 2022-05-15 DIAGNOSIS — N6012 Diffuse cystic mastopathy of left breast: Secondary | ICD-10-CM | POA: Diagnosis not present

## 2022-05-15 DIAGNOSIS — D242 Benign neoplasm of left breast: Secondary | ICD-10-CM | POA: Diagnosis not present

## 2022-05-15 HISTORY — PX: BREAST BIOPSY: SHX20

## 2022-06-03 DIAGNOSIS — H66002 Acute suppurative otitis media without spontaneous rupture of ear drum, left ear: Secondary | ICD-10-CM | POA: Diagnosis not present

## 2022-06-03 DIAGNOSIS — J019 Acute sinusitis, unspecified: Secondary | ICD-10-CM | POA: Diagnosis not present

## 2022-06-03 DIAGNOSIS — R059 Cough, unspecified: Secondary | ICD-10-CM | POA: Diagnosis not present

## 2022-06-15 ENCOUNTER — Ambulatory Visit: Payer: Self-pay | Admitting: General Surgery

## 2022-06-15 DIAGNOSIS — D242 Benign neoplasm of left breast: Secondary | ICD-10-CM

## 2022-06-18 DIAGNOSIS — E063 Autoimmune thyroiditis: Secondary | ICD-10-CM | POA: Diagnosis not present

## 2022-06-18 DIAGNOSIS — E039 Hypothyroidism, unspecified: Secondary | ICD-10-CM | POA: Diagnosis not present

## 2022-06-20 ENCOUNTER — Inpatient Hospital Stay: Admission: RE | Admit: 2022-06-20 | Payer: Medicare HMO | Source: Ambulatory Visit

## 2022-06-20 ENCOUNTER — Other Ambulatory Visit: Payer: Self-pay | Admitting: General Surgery

## 2022-06-20 DIAGNOSIS — D242 Benign neoplasm of left breast: Secondary | ICD-10-CM

## 2022-06-25 DIAGNOSIS — E669 Obesity, unspecified: Secondary | ICD-10-CM | POA: Diagnosis not present

## 2022-06-25 DIAGNOSIS — E063 Autoimmune thyroiditis: Secondary | ICD-10-CM | POA: Diagnosis not present

## 2022-06-25 DIAGNOSIS — E039 Hypothyroidism, unspecified: Secondary | ICD-10-CM | POA: Diagnosis not present

## 2022-07-03 ENCOUNTER — Encounter (HOSPITAL_BASED_OUTPATIENT_CLINIC_OR_DEPARTMENT_OTHER): Payer: Self-pay | Admitting: General Surgery

## 2022-07-03 ENCOUNTER — Other Ambulatory Visit: Payer: Self-pay

## 2022-07-05 ENCOUNTER — Ambulatory Visit
Admission: RE | Admit: 2022-07-05 | Discharge: 2022-07-05 | Disposition: A | Discharge status: Home / Self Care | Payer: Medicare HMO | Source: Ambulatory Visit | Attending: General Surgery | Admitting: General Surgery

## 2022-07-05 DIAGNOSIS — R928 Other abnormal and inconclusive findings on diagnostic imaging of breast: Secondary | ICD-10-CM | POA: Diagnosis not present

## 2022-07-05 DIAGNOSIS — D242 Benign neoplasm of left breast: Secondary | ICD-10-CM

## 2022-07-05 NOTE — Progress Notes (Signed)

## 2022-07-06 ENCOUNTER — Other Ambulatory Visit: Payer: Self-pay

## 2022-07-06 ENCOUNTER — Ambulatory Visit (HOSPITAL_BASED_OUTPATIENT_CLINIC_OR_DEPARTMENT_OTHER): Payer: Medicare HMO | Admitting: Anesthesiology

## 2022-07-06 ENCOUNTER — Ambulatory Visit
Admission: RE | Admit: 2022-07-06 | Discharge: 2022-07-06 | Disposition: A | Discharge status: Home / Self Care | Payer: Medicare HMO | Source: Ambulatory Visit | Attending: General Surgery | Admitting: General Surgery

## 2022-07-06 ENCOUNTER — Encounter (HOSPITAL_BASED_OUTPATIENT_CLINIC_OR_DEPARTMENT_OTHER)
Admission: RE | Disposition: A | Discharge status: Home / Self Care | Payer: Self-pay | Source: Home / Self Care | Attending: General Surgery

## 2022-07-06 ENCOUNTER — Ambulatory Visit (HOSPITAL_BASED_OUTPATIENT_CLINIC_OR_DEPARTMENT_OTHER)
Admission: RE | Admit: 2022-07-06 | Discharge: 2022-07-06 | Disposition: A | Discharge status: Home / Self Care | Payer: Medicare HMO | Attending: General Surgery | Admitting: General Surgery

## 2022-07-06 ENCOUNTER — Encounter (HOSPITAL_BASED_OUTPATIENT_CLINIC_OR_DEPARTMENT_OTHER): Payer: Self-pay | Admitting: General Surgery

## 2022-07-06 DIAGNOSIS — E039 Hypothyroidism, unspecified: Secondary | ICD-10-CM | POA: Insufficient documentation

## 2022-07-06 DIAGNOSIS — D242 Benign neoplasm of left breast: Secondary | ICD-10-CM

## 2022-07-06 DIAGNOSIS — Z01818 Encounter for other preprocedural examination: Secondary | ICD-10-CM

## 2022-07-06 DIAGNOSIS — N6012 Diffuse cystic mastopathy of left breast: Secondary | ICD-10-CM | POA: Diagnosis not present

## 2022-07-06 DIAGNOSIS — R921 Mammographic calcification found on diagnostic imaging of breast: Secondary | ICD-10-CM | POA: Diagnosis not present

## 2022-07-06 DIAGNOSIS — C50912 Malignant neoplasm of unspecified site of left female breast: Secondary | ICD-10-CM | POA: Diagnosis not present

## 2022-07-06 HISTORY — DX: Other complications of anesthesia, initial encounter: T88.59XA

## 2022-07-06 HISTORY — DX: Other specified postprocedural states: Z98.890

## 2022-07-06 HISTORY — DX: Benign neoplasm of left breast: D24.2

## 2022-07-06 HISTORY — PX: BREAST LUMPECTOMY WITH RADIOACTIVE SEED LOCALIZATION: SHX6424

## 2022-07-06 HISTORY — DX: Gastro-esophageal reflux disease without esophagitis: K21.9

## 2022-07-06 HISTORY — DX: Hyperlipidemia, unspecified: E78.5

## 2022-07-06 HISTORY — DX: Hypothyroidism, unspecified: E03.9

## 2022-07-06 SURGERY — BREAST LUMPECTOMY WITH RADIOACTIVE SEED LOCALIZATION
Anesthesia: General | Site: Breast | Laterality: Left

## 2022-07-06 MED ORDER — CEFAZOLIN SODIUM-DEXTROSE 2-4 GM/100ML-% IV SOLN
INTRAVENOUS | Status: AC
  Filled 2022-07-06: qty 100

## 2022-07-06 MED ORDER — LACTATED RINGERS IV SOLN: INTRAVENOUS | Status: DC

## 2022-07-06 MED ORDER — OXYCODONE HCL 5 MG PO TABS: 5.0000 mg | ORAL_TABLET | Freq: Four times a day (QID) | ORAL | 0 refills | Status: AC | PRN

## 2022-07-06 MED ORDER — PROPOFOL 10 MG/ML IV BOLUS
INTRAVENOUS | Status: DC | PRN
  Administered 2022-07-06: 150 mg via INTRAVENOUS

## 2022-07-06 MED ORDER — ACETAMINOPHEN 500 MG PO TABS
1000.0000 mg | ORAL_TABLET | ORAL | Status: AC
  Administered 2022-07-06: 1000 mg via ORAL

## 2022-07-06 MED ORDER — GABAPENTIN 300 MG PO CAPS
ORAL_CAPSULE | ORAL | Status: AC
  Filled 2022-07-06: qty 1

## 2022-07-06 MED ORDER — CHLORHEXIDINE GLUCONATE CLOTH 2 % EX PADS: 6.0000 | MEDICATED_PAD | Freq: Once | CUTANEOUS | Status: DC

## 2022-07-06 MED ORDER — BUPIVACAINE-EPINEPHRINE (PF) 0.25% -1:200000 IJ SOLN
INTRAMUSCULAR | Status: DC | PRN
  Administered 2022-07-06: 20 mL

## 2022-07-06 MED ORDER — ONDANSETRON HCL 4 MG/2ML IJ SOLN
INTRAMUSCULAR | Status: DC | PRN
  Administered 2022-07-06: 4 mg via INTRAVENOUS

## 2022-07-06 MED ORDER — CEFAZOLIN SODIUM-DEXTROSE 2-4 GM/100ML-% IV SOLN
2.0000 g | INTRAVENOUS | Status: AC
  Administered 2022-07-06: 2 g via INTRAVENOUS

## 2022-07-06 MED ORDER — GABAPENTIN 300 MG PO CAPS
300.0000 mg | ORAL_CAPSULE | ORAL | Status: AC
  Administered 2022-07-06: 300 mg via ORAL

## 2022-07-06 MED ORDER — FENTANYL CITRATE (PF) 100 MCG/2ML IJ SOLN
INTRAMUSCULAR | Status: AC
  Filled 2022-07-06: qty 2

## 2022-07-06 MED ORDER — MIDAZOLAM HCL 5 MG/5ML IJ SOLN
INTRAMUSCULAR | Status: DC | PRN
  Administered 2022-07-06: 2 mg via INTRAVENOUS

## 2022-07-06 MED ORDER — ACETAMINOPHEN 500 MG PO TABS
ORAL_TABLET | ORAL | Status: AC
  Filled 2022-07-06: qty 2

## 2022-07-06 MED ORDER — FENTANYL CITRATE (PF) 100 MCG/2ML IJ SOLN
INTRAMUSCULAR | Status: DC | PRN
  Administered 2022-07-06: 50 ug via INTRAVENOUS

## 2022-07-06 MED ORDER — DEXAMETHASONE SODIUM PHOSPHATE 4 MG/ML IJ SOLN
INTRAMUSCULAR | Status: DC | PRN
  Administered 2022-07-06: 6 mg via INTRAVENOUS

## 2022-07-06 MED ORDER — MIDAZOLAM HCL 2 MG/2ML IJ SOLN
INTRAMUSCULAR | Status: AC
  Filled 2022-07-06: qty 2

## 2022-07-06 MED ORDER — LIDOCAINE 2% (20 MG/ML) 5 ML SYRINGE
INTRAMUSCULAR | Status: DC | PRN
  Administered 2022-07-06: 60 mg via INTRAVENOUS

## 2022-07-06 MED ORDER — EPHEDRINE SULFATE-NACL 50-0.9 MG/10ML-% IV SOSY
PREFILLED_SYRINGE | INTRAVENOUS | Status: DC | PRN
  Administered 2022-07-06: 10 mg via INTRAVENOUS

## 2022-07-06 MED ORDER — MIDAZOLAM HCL (PF) 10 MG/2ML IJ SOLN
INTRAMUSCULAR | Status: AC
  Filled 2022-07-06: qty 2

## 2022-07-06 MED ORDER — CELECOXIB 200 MG PO CAPS
ORAL_CAPSULE | ORAL | Status: AC
  Filled 2022-07-06: qty 1

## 2022-07-06 MED ORDER — PHENYLEPHRINE 80 MCG/ML (10ML) SYRINGE FOR IV PUSH (FOR BLOOD PRESSURE SUPPORT)
PREFILLED_SYRINGE | INTRAVENOUS | Status: DC | PRN
  Administered 2022-07-06: 160 ug via INTRAVENOUS
  Administered 2022-07-06 (×3): 80 ug via INTRAVENOUS
  Administered 2022-07-06: 160 ug via INTRAVENOUS

## 2022-07-06 MED ORDER — CELECOXIB 200 MG PO CAPS
200.0000 mg | ORAL_CAPSULE | ORAL | Status: AC
  Administered 2022-07-06: 200 mg via ORAL

## 2022-07-06 SURGICAL SUPPLY — 38 items
ADH SKN CLS APL DERMABOND .7 (GAUZE/BANDAGES/DRESSINGS) ×1
APL PRP STRL LF DISP 70% ISPRP (MISCELLANEOUS) ×1
APPLIER CLIP 9.375 MED OPEN (MISCELLANEOUS)
APR CLP MED 9.3 20 MLT OPN (MISCELLANEOUS)
BLADE SURG 15 STRL LF DISP TIS (BLADE) ×1 IMPLANT
BLADE SURG 15 STRL SS (BLADE) ×1
CANISTER SUC SOCK COL 7IN (MISCELLANEOUS) ×1 IMPLANT
CANISTER SUCT 1200ML W/VALVE (MISCELLANEOUS) ×1 IMPLANT
CHLORAPREP W/TINT 26 (MISCELLANEOUS) ×1 IMPLANT
CLIP APPLIE 9.375 MED OPEN (MISCELLANEOUS) IMPLANT
COVER BACK TABLE 60X90IN (DRAPES) ×1 IMPLANT
COVER MAYO STAND STRL (DRAPES) ×1 IMPLANT
COVER PROBE W GEL 5X96 (DRAPES) ×1 IMPLANT
DERMABOND ADVANCED .7 DNX12 (GAUZE/BANDAGES/DRESSINGS) ×1 IMPLANT
DRAPE LAPAROSCOPIC ABDOMINAL (DRAPES) ×1 IMPLANT
DRAPE UTILITY XL STRL (DRAPES) ×1 IMPLANT
ELECT COATED BLADE 2.86 ST (ELECTRODE) ×1 IMPLANT
ELECT REM PT RETURN 9FT ADLT (ELECTROSURGICAL) ×1
ELECTRODE REM PT RTRN 9FT ADLT (ELECTROSURGICAL) ×1 IMPLANT
GLOVE BIO SURGEON STRL SZ7.5 (GLOVE) ×2 IMPLANT
GOWN STRL REUS W/ TWL LRG LVL3 (GOWN DISPOSABLE) ×2 IMPLANT
GOWN STRL REUS W/TWL LRG LVL3 (GOWN DISPOSABLE) ×2
KIT MARKER MARGIN INK (KITS) ×1 IMPLANT
NDL HYPO 25X1 1.5 SAFETY (NEEDLE) IMPLANT
NEEDLE HYPO 25X1 1.5 SAFETY (NEEDLE) ×1 IMPLANT
NS IRRIG 500ML POUR BTL (IV SOLUTION) IMPLANT
PACK BASIN DAY SURGERY FS (CUSTOM PROCEDURE TRAY) ×1 IMPLANT
PENCIL SMOKE EVACUATOR (MISCELLANEOUS) ×1 IMPLANT
SLEEVE SCD COMPRESS KNEE MED (STOCKING) ×1 IMPLANT
SPONGE T-LAP 18X18 ~~LOC~~+RFID (SPONGE) ×1 IMPLANT
SUT MON AB 4-0 PC3 18 (SUTURE) ×1 IMPLANT
SUT SILK 2 0 SH (SUTURE) IMPLANT
SUT VICRYL 3-0 CR8 SH (SUTURE) ×1 IMPLANT
SYR CONTROL 10ML LL (SYRINGE) IMPLANT
TOWEL GREEN STERILE FF (TOWEL DISPOSABLE) ×1 IMPLANT
TRAY FAXITRON CT DISP (TRAY / TRAY PROCEDURE) ×1 IMPLANT
TUBE CONNECTING 20X1/4 (TUBING) ×1 IMPLANT
YANKAUER SUCT BULB TIP NO VENT (SUCTIONS) IMPLANT

## 2022-07-06 NOTE — H&P (Signed)
REFERRING PHYSICIAN: Jake Samples, MD  PROVIDER: Landry Corporal, MD  MRN: [id number] DOB: 1956-02-28 Subjective   Chief Complaint: New Patient (Small intraductal papilloma)   History of Present Illness: Judy [name] is a 66 y.o. female who is seen today as an office consultation for evaluation of New Patient (Small intraductal papilloma) .   We are asked to see the patient in consultation by Dr. Marylynn Pearson to evaluate her for a papilloma of the left breast. The patient is a 66 year old white female who recently went for a routine screening mammogram. At that time she was found to have a 7 mm mass in the upper outer quadrant of the left breast. This mass was biopsied and came back as an intraductal papilloma. She denies any family history of breast cancer. She is otherwise in pretty good health and does not smoke.  Review of Systems: A complete review of systems was obtained from the patient. I have reviewed this information and discussed as appropriate with the patient. See HPI as well for other ROS.  ROS   Medical History: Past Medical History:  Diagnosis Date  GERD (gastroesophageal reflux disease)  Hyperlipidemia  Thyroid disease   Patient Active Problem List  Diagnosis  Intraductal papilloma of breast, left   History reviewed. No pertinent surgical history.   Allergies  Allergen Reactions  Sulfamethoxazole-Trimethoprim Nausea And Vomiting   Current Outpatient Medications on File Prior to Visit  Medication Sig Dispense Refill  triamcinolone 0.1 % ointment 1 application Externally Twice a day for 14 days  valACYclovir (VALTREX) 1000 MG tablet  aspirin 81 MG chewable tablet 1 tablet Orally Once a day for 30 day(s)  esomeprazole (NEXIUM) 40 MG DR capsule  levothyroxine (SYNTHROID) 50 MCG tablet TAKE 1 TABLET ON AM EMPTY STOMACH IN THE MORNING  simvastatin (ZOCOR) 20 MG tablet Take 1 tablet by mouth every evening   No current facility-administered  medications on file prior to visit.   History reviewed. No pertinent family history.   Social History   Tobacco Use  Smoking Status Never  Smokeless Tobacco Never    Social History   Socioeconomic History  Marital status: Married  Tobacco Use  Smoking status: Never  Smokeless tobacco: Never  Substance and Sexual Activity  Alcohol use: Yes  Drug use: Never   Objective:   Vitals:  BP: 124/76  Pulse: 100  Temp: 36.8 C (98.2 F)  SpO2: 96%  Weight: 78.3 kg (172 lb 9.6 oz)  Height: 162.6 cm ('5\' 4"'$ )   Body mass index is 29.63 kg/m.  Physical Exam Vitals reviewed.  Constitutional:  General: She is not in acute distress. Appearance: Normal appearance.  HENT:  Head: Normocephalic and atraumatic.  Right Ear: External ear normal.  Left Ear: External ear normal.  Nose: Nose normal.  Mouth/Throat:  Mouth: Mucous membranes are moist.  Pharynx: Oropharynx is clear.  Eyes:  General: No scleral icterus. Extraocular Movements: Extraocular movements intact.  Conjunctiva/sclera: Conjunctivae normal.  Pupils: Pupils are equal, round, and reactive to light.  Cardiovascular:  Rate and Rhythm: Normal rate and regular rhythm.  Pulses: Normal pulses.  Heart sounds: Normal heart sounds.  Pulmonary:  Effort: Pulmonary effort is normal. No respiratory distress.  Breath sounds: Normal breath sounds.  Abdominal:  General: Bowel sounds are normal.  Palpations: Abdomen is soft.  Tenderness: There is no abdominal tenderness.  Musculoskeletal:  General: No swelling, tenderness or deformity. Normal range of motion.  Cervical back: Normal range of motion and neck supple.  Skin: General: Skin is warm and dry.  Coloration: Skin is not jaundiced.  Neurological:  General: No focal deficit present.  Mental Status: She is alert and oriented to person, place, and time.  Psychiatric:  Mood and Affect: Mood normal.  Behavior: Behavior normal.    Breast: There is no palpable mass in  either breast. There is no palpable axillary, supraclavicular, or cervical lymphadenopathy.  Labs, Imaging and Diagnostic Testing:  Assessment and Plan:   Diagnoses and all orders for this visit:  Intraductal papilloma of breast, left    The patient appears to have a 7 mm intraductal papilloma in the upper outer quadrant of the left breast. Because of its appearance and because there is a 5 to 10% chance of missing something more significant the recommendation is to have this area removed. She would also like to have this done. I have discussed with her in detail the risks and benefits of the operation as well as some of the technical aspects including the use of a radioactive seed for localization and she understands and wishes to proceed.

## 2022-07-06 NOTE — Op Note (Signed)
07/06/2022  9:06 AM  PATIENT:  Judy [name]  66 y.o. female  PRE-OPERATIVE DIAGNOSIS:  LEFT BREAST PAPILLOMA  POST-OPERATIVE DIAGNOSIS:  LEFT BREAST PAPILLOMA  PROCEDURE:  Procedure(s): LEFT BREAST LUMPECTOMY WITH RADIOACTIVE SEED LOCALIZATION (Left)  SURGEON:  Surgeon(s) and Role:    * Jovita Kussmaul, MD - Primary  PHYSICIAN ASSISTANT:   ASSISTANTS: none   ANESTHESIA:   local and general  EBL:  minimal   BLOOD ADMINISTERED:none  DRAINS: none   LOCAL MEDICATIONS USED:  MARCAINE     SPECIMEN:  Source of Specimen:  left breast tissue  DISPOSITION OF SPECIMEN:  PATHOLOGY  COUNTS:  YES  TOURNIQUET:  * No tourniquets in log *  DICTATION: .Dragon Dictation  After informed consent was obtained the patient was brought to the operating room and placed in the supine position on the operating table.  After adequate induction of general anesthesia the patient's left breast was prepped with Betadine and draped in usual sterile manner.  An appropriate timeout was performed.  Previously an I-125 seed was placed in the upper outer central left breast to mark an area of intraductal papilloma.  The neoprobe was set to I-125 in the area of radioactivity was readily identified.  The area around this was infiltrated with quarter percent Marcaine.  A curvilinear incision was made along the upper outer edge of the areola of the left breast with a 15 blade knife.  The incision was carried through the skin and subcutaneous tissue sharply with the electrocautery.  Dissection was then carried towards the radioactive seed under the direction of the neoprobe.  Once I more closely approached the radioactive seed I then removed the circular portion of breast tissue sharply with the electrocautery around the radioactive seed while checking the area of radioactivity frequently.  Once the specimen was removed it was oriented with the appropriate paint colors.  A specimen radiograph was obtained that showed  the clip and seed to be near the center of the specimen.  The specimen was then sent to pathology for further evaluation.  Hemostasis was achieved using the Bovie electrocautery.  The wound was irrigated with saline and infiltrated with more quarter percent Marcaine.  The deep layer of the incision was then closed with interrupted 3-0 Vicryl stitches.  The skin was then closed with interrupted 4-0 Monocryl subcuticular stitches.  Dermabond dressings were applied.  The patient tolerated the procedure well.  At the end of the case all needle sponge and instrument counts were correct.  The patient was then awakened and taken to recovery in stable condition.  PLAN OF CARE: Discharge to home after PACU  PATIENT DISPOSITION:  PACU - hemodynamically stable.   Delay start of Pharmacological VTE agent (>24hrs) due to surgical blood loss or risk of bleeding: not applicable

## 2022-07-06 NOTE — Anesthesia Preprocedure Evaluation (Signed)
Anesthesia Evaluation  Patient identified by MRN, date of birth, ID band Patient awake    Reviewed: Allergy & Precautions, H&P , NPO status , Patient's Chart, lab work & pertinent test results  History of Anesthesia Complications (+) PONV and history of anesthetic complications  Airway Mallampati: II   Neck ROM: full    Dental   Pulmonary neg pulmonary ROS,    breath sounds clear to auscultation       Cardiovascular negative cardio ROS   Rhythm:regular Rate:Normal     Neuro/Psych    GI/Hepatic GERD  ,  Endo/Other  Hypothyroidism   Renal/GU      Musculoskeletal   Abdominal   Peds  Hematology   Anesthesia Other Findings   Reproductive/Obstetrics Breast mass                             Anesthesia Physical Anesthesia Plan  ASA: 2  Anesthesia Plan: General   Post-op Pain Management:    Induction: Intravenous  PONV Risk Score and Plan: 4 or greater and Ondansetron, Dexamethasone, Midazolam and Treatment may vary due to age or medical condition  Airway Management Planned: LMA  Additional Equipment:   Intra-op Plan:   Post-operative Plan: Extubation in OR  Informed Consent: I have reviewed the patients History and Physical, chart, labs and discussed the procedure including the risks, benefits and alternatives for the proposed anesthesia with the patient or authorized representative who has indicated his/her understanding and acceptance.     Dental advisory given  Plan Discussed with: CRNA, Anesthesiologist and Surgeon  Anesthesia Plan Comments:         Anesthesia Quick Evaluation

## 2022-07-06 NOTE — Anesthesia Procedure Notes (Signed)
Procedure Name: LMA Insertion Date/Time: 07/06/2022 8:31 AM  Performed by: Lieutenant Diego, CRNAPre-anesthesia Checklist: Patient identified, Emergency Drugs available, Suction available and Patient being monitored Patient Re-evaluated:Patient Re-evaluated prior to induction Oxygen Delivery Method: Circle system utilized Preoxygenation: Pre-oxygenation with 100% oxygen Induction Type: IV induction Ventilation: Mask ventilation without difficulty LMA: LMA inserted LMA Size: 4.0 Number of attempts: 1 Airway Equipment and Method: Bite block Placement Confirmation: positive ETCO2 and breath sounds checked- equal and bilateral Tube secured with: Tape Dental Injury: Teeth and Oropharynx as per pre-operative assessment

## 2022-07-06 NOTE — Transfer of Care (Signed)
Immediate Anesthesia Transfer of Care Note  Patient: Judy [name]  Procedure(s) Performed: LEFT BREAST LUMPECTOMY WITH RADIOACTIVE SEED LOCALIZATION (Left: Breast)  Patient Location: PACU  Anesthesia Type:General  Level of Consciousness: awake  Airway & Oxygen Therapy: Patient Spontanous Breathing and Patient connected to face mask oxygen  Post-op Assessment: Report given to RN and Post -op Vital signs reviewed and stable  Post vital signs: Reviewed and stable  Last Vitals:  Vitals Value Taken Time  BP 117/57 07/06/22 0915  Temp 36.2 C 07/06/22 0913  Pulse 88 07/06/22 0922  Resp 13 07/06/22 0922  SpO2 95 % 07/06/22 0922  Vitals shown include unvalidated device data.  Last Pain:  Vitals:   07/06/22 0913  TempSrc:   PainSc: Asleep         Complications: No notable events documented.

## 2022-07-06 NOTE — Discharge Instructions (Addendum)
May take Tylenol after 1:30pm, if needed.  May take NSAIDS (ibuprofen, motrin) after 1:30pm, if needed.     Post Anesthesia Home Care Instructions  Activity: Get plenty of rest for the remainder of the day. A responsible individual must stay with you for 24 hours following the procedure.  For the next 24 hours, DO NOT: -Drive a car -Operate machinery -Drink alcoholic beverages -Take any medication unless instructed by your physician -Make any legal decisions or sign important papers.  Meals: Start with liquid foods such as gelatin or soup. Progress to regular foods as tolerated. Avoid greasy, spicy, heavy foods. If nausea and/or vomiting occur, drink only clear liquids until the nausea and/or vomiting subsides. Call your physician if vomiting continues.  Special Instructions/Symptoms: Your throat may feel dry or sore from the anesthesia or the breathing tube placed in your throat during surgery. If this causes discomfort, gargle with warm salt water. The discomfort should disappear within 24 hours.  If you had a scopolamine patch placed behind your ear for the management of post- operative nausea and/or vomiting:  1. The medication in the patch is effective for 72 hours, after which it should be removed.  Wrap patch in a tissue and discard in the trash. Wash hands thoroughly with soap and water. 2. You may remove the patch earlier than 72 hours if you experience unpleasant side effects which may include dry mouth, dizziness or visual disturbances. 3. Avoid touching the patch. Wash your hands with soap and water after contact with the patch.    

## 2022-07-06 NOTE — Interval H&P Note (Signed)
History and Physical Interval Note:  07/06/2022 8:11 AM  Judy [name]  has presented today for surgery, with the diagnosis of LEFT BREAST PAPILLOMA.  The various methods of treatment have been discussed with the patient and family. After consideration of risks, benefits and other options for treatment, the patient has consented to  Procedure(s): LEFT BREAST LUMPECTOMY WITH RADIOACTIVE SEED LOCALIZATION (Left) as a surgical intervention.  The patient's history has been reviewed, patient examined, no change in status, stable for surgery.  I have reviewed the patient's chart and labs.  Questions were answered to the patient's satisfaction.     [name]

## 2022-07-07 NOTE — Anesthesia Postprocedure Evaluation (Signed)
Anesthesia Post Note  Patient: Judy [name]  Procedure(s) Performed: LEFT BREAST LUMPECTOMY WITH RADIOACTIVE SEED LOCALIZATION (Left: Breast)     Patient location during evaluation: PACU Anesthesia Type: General Level of consciousness: awake and alert Pain management: pain level controlled Vital Signs Assessment: post-procedure vital signs reviewed and stable Respiratory status: spontaneous breathing, nonlabored ventilation, respiratory function stable and patient connected to nasal cannula oxygen Cardiovascular status: blood pressure returned to baseline and stable Postop Assessment: no apparent nausea or vomiting Anesthetic complications: no   No notable events documented.  Last Vitals:  Vitals:   07/06/22 0930 07/06/22 0942  BP: 118/61 130/62  Pulse: 93 91  Resp: 14 16  Temp:  (!) 36.2 C  SpO2: 94% 94%    Last Pain:  Vitals:   07/06/22 0942  TempSrc:   PainSc: 0-No pain                 Sarai January S

## 2022-07-09 ENCOUNTER — Encounter (HOSPITAL_BASED_OUTPATIENT_CLINIC_OR_DEPARTMENT_OTHER): Payer: Self-pay | Admitting: General Surgery

## 2022-07-09 LAB — SURGICAL PATHOLOGY

## 2022-07-17 ENCOUNTER — Encounter (HOSPITAL_COMMUNITY): Payer: Self-pay

## 2022-07-28 DIAGNOSIS — N3000 Acute cystitis without hematuria: Secondary | ICD-10-CM | POA: Diagnosis not present

## 2022-07-28 DIAGNOSIS — R350 Frequency of micturition: Secondary | ICD-10-CM | POA: Diagnosis not present

## 2022-08-15 DIAGNOSIS — R82998 Other abnormal findings in urine: Secondary | ICD-10-CM | POA: Diagnosis not present

## 2022-08-15 DIAGNOSIS — R300 Dysuria: Secondary | ICD-10-CM | POA: Diagnosis not present

## 2022-10-15 DIAGNOSIS — N390 Urinary tract infection, site not specified: Secondary | ICD-10-CM | POA: Diagnosis not present

## 2022-10-24 DIAGNOSIS — E785 Hyperlipidemia, unspecified: Secondary | ICD-10-CM | POA: Diagnosis not present

## 2022-10-24 DIAGNOSIS — G4739 Other sleep apnea: Secondary | ICD-10-CM | POA: Diagnosis not present

## 2022-10-31 DIAGNOSIS — R69 Illness, unspecified: Secondary | ICD-10-CM | POA: Diagnosis not present

## 2022-10-31 DIAGNOSIS — Z01419 Encounter for gynecological examination (general) (routine) without abnormal findings: Secondary | ICD-10-CM | POA: Diagnosis not present

## 2022-10-31 DIAGNOSIS — N390 Urinary tract infection, site not specified: Secondary | ICD-10-CM | POA: Diagnosis not present

## 2022-10-31 DIAGNOSIS — Z6830 Body mass index (BMI) 30.0-30.9, adult: Secondary | ICD-10-CM | POA: Diagnosis not present

## 2022-10-31 DIAGNOSIS — Z124 Encounter for screening for malignant neoplasm of cervix: Secondary | ICD-10-CM | POA: Diagnosis not present

## 2022-11-09 DIAGNOSIS — H5203 Hypermetropia, bilateral: Secondary | ICD-10-CM | POA: Diagnosis not present

## 2022-12-04 DIAGNOSIS — G4719 Other hypersomnia: Secondary | ICD-10-CM | POA: Diagnosis not present

## 2023-01-10 DIAGNOSIS — L723 Sebaceous cyst: Secondary | ICD-10-CM | POA: Diagnosis not present

## 2023-01-10 DIAGNOSIS — L821 Other seborrheic keratosis: Secondary | ICD-10-CM | POA: Diagnosis not present

## 2023-01-10 DIAGNOSIS — L570 Actinic keratosis: Secondary | ICD-10-CM | POA: Diagnosis not present

## 2023-01-10 DIAGNOSIS — L578 Other skin changes due to chronic exposure to nonionizing radiation: Secondary | ICD-10-CM | POA: Diagnosis not present

## 2023-01-14 DIAGNOSIS — G4733 Obstructive sleep apnea (adult) (pediatric): Secondary | ICD-10-CM | POA: Diagnosis not present

## 2023-01-21 DIAGNOSIS — N952 Postmenopausal atrophic vaginitis: Secondary | ICD-10-CM | POA: Diagnosis not present

## 2023-02-11 DIAGNOSIS — G4733 Obstructive sleep apnea (adult) (pediatric): Secondary | ICD-10-CM | POA: Diagnosis not present

## 2023-03-14 DIAGNOSIS — G4733 Obstructive sleep apnea (adult) (pediatric): Secondary | ICD-10-CM | POA: Diagnosis not present

## 2023-04-13 DIAGNOSIS — G4733 Obstructive sleep apnea (adult) (pediatric): Secondary | ICD-10-CM | POA: Diagnosis not present

## 2023-04-25 DIAGNOSIS — G4733 Obstructive sleep apnea (adult) (pediatric): Secondary | ICD-10-CM | POA: Diagnosis not present

## 2023-04-25 DIAGNOSIS — G4719 Other hypersomnia: Secondary | ICD-10-CM | POA: Diagnosis not present

## 2023-04-25 DIAGNOSIS — E039 Hypothyroidism, unspecified: Secondary | ICD-10-CM | POA: Diagnosis not present

## 2023-04-25 DIAGNOSIS — E669 Obesity, unspecified: Secondary | ICD-10-CM | POA: Diagnosis not present

## 2023-04-25 DIAGNOSIS — E559 Vitamin D deficiency, unspecified: Secondary | ICD-10-CM | POA: Diagnosis not present

## 2023-05-08 DIAGNOSIS — M8588 Other specified disorders of bone density and structure, other site: Secondary | ICD-10-CM | POA: Diagnosis not present

## 2023-05-08 DIAGNOSIS — Z23 Encounter for immunization: Secondary | ICD-10-CM | POA: Diagnosis not present

## 2023-05-08 DIAGNOSIS — Z0000 Encounter for general adult medical examination without abnormal findings: Secondary | ICD-10-CM | POA: Diagnosis not present

## 2023-05-08 DIAGNOSIS — K219 Gastro-esophageal reflux disease without esophagitis: Secondary | ICD-10-CM | POA: Diagnosis not present

## 2023-05-08 DIAGNOSIS — E559 Vitamin D deficiency, unspecified: Secondary | ICD-10-CM | POA: Diagnosis not present

## 2023-05-08 DIAGNOSIS — E039 Hypothyroidism, unspecified: Secondary | ICD-10-CM | POA: Diagnosis not present

## 2023-05-08 DIAGNOSIS — E7800 Pure hypercholesterolemia, unspecified: Secondary | ICD-10-CM | POA: Diagnosis not present

## 2023-05-08 DIAGNOSIS — E538 Deficiency of other specified B group vitamins: Secondary | ICD-10-CM | POA: Diagnosis not present

## 2023-05-14 DIAGNOSIS — G4733 Obstructive sleep apnea (adult) (pediatric): Secondary | ICD-10-CM | POA: Diagnosis not present

## 2023-06-14 DIAGNOSIS — G4733 Obstructive sleep apnea (adult) (pediatric): Secondary | ICD-10-CM | POA: Diagnosis not present

## 2023-07-14 DIAGNOSIS — G4733 Obstructive sleep apnea (adult) (pediatric): Secondary | ICD-10-CM | POA: Diagnosis not present

## 2023-08-13 ENCOUNTER — Encounter: Payer: Self-pay | Admitting: Family Medicine

## 2023-08-13 ENCOUNTER — Other Ambulatory Visit: Payer: Self-pay | Admitting: Family Medicine

## 2023-08-13 DIAGNOSIS — Z9889 Other specified postprocedural states: Secondary | ICD-10-CM

## 2023-08-14 DIAGNOSIS — G4733 Obstructive sleep apnea (adult) (pediatric): Secondary | ICD-10-CM | POA: Diagnosis not present

## 2023-08-15 DIAGNOSIS — G4733 Obstructive sleep apnea (adult) (pediatric): Secondary | ICD-10-CM | POA: Diagnosis not present

## 2023-08-23 ENCOUNTER — Ambulatory Visit
Admission: RE | Admit: 2023-08-23 | Discharge: 2023-08-23 | Disposition: A | Discharge status: Home / Self Care | Payer: Medicare HMO | Source: Ambulatory Visit | Attending: Family Medicine | Admitting: Family Medicine

## 2023-08-23 DIAGNOSIS — Z9889 Other specified postprocedural states: Secondary | ICD-10-CM

## 2023-08-23 DIAGNOSIS — Z853 Personal history of malignant neoplasm of breast: Secondary | ICD-10-CM | POA: Diagnosis not present

## 2023-09-12 DIAGNOSIS — M8588 Other specified disorders of bone density and structure, other site: Secondary | ICD-10-CM | POA: Diagnosis not present

## 2023-09-13 DIAGNOSIS — G4733 Obstructive sleep apnea (adult) (pediatric): Secondary | ICD-10-CM | POA: Diagnosis not present

## 2023-10-14 DIAGNOSIS — G4733 Obstructive sleep apnea (adult) (pediatric): Secondary | ICD-10-CM | POA: Diagnosis not present

## 2023-11-14 DIAGNOSIS — G4733 Obstructive sleep apnea (adult) (pediatric): Secondary | ICD-10-CM | POA: Diagnosis not present

## 2024-01-12 DIAGNOSIS — G4733 Obstructive sleep apnea (adult) (pediatric): Secondary | ICD-10-CM | POA: Diagnosis not present

## 2024-02-10 DIAGNOSIS — H52223 Regular astigmatism, bilateral: Secondary | ICD-10-CM | POA: Diagnosis not present

## 2024-02-10 DIAGNOSIS — H2513 Age-related nuclear cataract, bilateral: Secondary | ICD-10-CM | POA: Diagnosis not present

## 2024-02-10 DIAGNOSIS — H524 Presbyopia: Secondary | ICD-10-CM | POA: Diagnosis not present

## 2024-02-10 DIAGNOSIS — H5203 Hypermetropia, bilateral: Secondary | ICD-10-CM | POA: Diagnosis not present

## 2024-02-13 DIAGNOSIS — Z6832 Body mass index (BMI) 32.0-32.9, adult: Secondary | ICD-10-CM | POA: Diagnosis not present

## 2024-02-13 DIAGNOSIS — R030 Elevated blood-pressure reading, without diagnosis of hypertension: Secondary | ICD-10-CM | POA: Diagnosis not present

## 2024-02-13 DIAGNOSIS — N390 Urinary tract infection, site not specified: Secondary | ICD-10-CM | POA: Diagnosis not present

## 2024-02-19 DIAGNOSIS — L821 Other seborrheic keratosis: Secondary | ICD-10-CM | POA: Diagnosis not present

## 2024-02-19 DIAGNOSIS — L570 Actinic keratosis: Secondary | ICD-10-CM | POA: Diagnosis not present

## 2024-02-19 DIAGNOSIS — D485 Neoplasm of uncertain behavior of skin: Secondary | ICD-10-CM | POA: Diagnosis not present

## 2024-02-19 DIAGNOSIS — D225 Melanocytic nevi of trunk: Secondary | ICD-10-CM | POA: Diagnosis not present

## 2024-02-19 DIAGNOSIS — L720 Epidermal cyst: Secondary | ICD-10-CM | POA: Diagnosis not present

## 2024-02-19 DIAGNOSIS — L578 Other skin changes due to chronic exposure to nonionizing radiation: Secondary | ICD-10-CM | POA: Diagnosis not present

## 2024-02-28 DIAGNOSIS — L200 Besnier's prurigo: Secondary | ICD-10-CM | POA: Diagnosis not present

## 2024-04-06 DIAGNOSIS — G4733 Obstructive sleep apnea (adult) (pediatric): Secondary | ICD-10-CM | POA: Diagnosis not present

## 2024-05-08 DIAGNOSIS — Z0000 Encounter for general adult medical examination without abnormal findings: Secondary | ICD-10-CM | POA: Diagnosis not present

## 2024-05-08 DIAGNOSIS — Z1231 Encounter for screening mammogram for malignant neoplasm of breast: Secondary | ICD-10-CM | POA: Diagnosis not present

## 2024-05-08 DIAGNOSIS — E538 Deficiency of other specified B group vitamins: Secondary | ICD-10-CM | POA: Diagnosis not present

## 2024-05-08 DIAGNOSIS — E7800 Pure hypercholesterolemia, unspecified: Secondary | ICD-10-CM | POA: Diagnosis not present

## 2024-05-08 DIAGNOSIS — K219 Gastro-esophageal reflux disease without esophagitis: Secondary | ICD-10-CM | POA: Diagnosis not present

## 2024-05-08 DIAGNOSIS — M8588 Other specified disorders of bone density and structure, other site: Secondary | ICD-10-CM | POA: Diagnosis not present

## 2024-05-08 DIAGNOSIS — R002 Palpitations: Secondary | ICD-10-CM | POA: Diagnosis not present

## 2024-05-08 DIAGNOSIS — Z1211 Encounter for screening for malignant neoplasm of colon: Secondary | ICD-10-CM | POA: Diagnosis not present

## 2024-05-08 DIAGNOSIS — E039 Hypothyroidism, unspecified: Secondary | ICD-10-CM | POA: Diagnosis not present

## 2024-05-08 DIAGNOSIS — E559 Vitamin D deficiency, unspecified: Secondary | ICD-10-CM | POA: Diagnosis not present

## 2024-05-08 DIAGNOSIS — Z6832 Body mass index (BMI) 32.0-32.9, adult: Secondary | ICD-10-CM | POA: Diagnosis not present

## 2024-05-22 DIAGNOSIS — K219 Gastro-esophageal reflux disease without esophagitis: Secondary | ICD-10-CM | POA: Diagnosis not present

## 2024-05-22 DIAGNOSIS — Z6832 Body mass index (BMI) 32.0-32.9, adult: Secondary | ICD-10-CM | POA: Diagnosis not present

## 2024-05-22 DIAGNOSIS — E7800 Pure hypercholesterolemia, unspecified: Secondary | ICD-10-CM | POA: Diagnosis not present

## 2024-08-26 ENCOUNTER — Other Ambulatory Visit: Payer: Self-pay | Admitting: Obstetrics and Gynecology

## 2024-08-26 DIAGNOSIS — Z9889 Other specified postprocedural states: Secondary | ICD-10-CM

## 2024-09-08 DIAGNOSIS — G4733 Obstructive sleep apnea (adult) (pediatric): Secondary | ICD-10-CM | POA: Diagnosis not present

## 2024-09-24 ENCOUNTER — Inpatient Hospital Stay
Admission: RE | Admit: 2024-09-24 | Discharge: 2024-09-24 | Attending: Obstetrics and Gynecology | Admitting: Obstetrics and Gynecology

## 2024-09-24 DIAGNOSIS — R928 Other abnormal and inconclusive findings on diagnostic imaging of breast: Secondary | ICD-10-CM | POA: Diagnosis not present

## 2024-09-24 DIAGNOSIS — Z9889 Other specified postprocedural states: Secondary | ICD-10-CM

## 2024-09-29 DIAGNOSIS — R399 Unspecified symptoms and signs involving the genitourinary system: Secondary | ICD-10-CM | POA: Diagnosis not present

## 2024-09-29 DIAGNOSIS — Z6834 Body mass index (BMI) 34.0-34.9, adult: Secondary | ICD-10-CM | POA: Diagnosis not present

## 2024-10-29 ENCOUNTER — Other Ambulatory Visit (HOSPITAL_BASED_OUTPATIENT_CLINIC_OR_DEPARTMENT_OTHER): Payer: Self-pay | Admitting: Family Medicine

## 2024-10-29 DIAGNOSIS — E7800 Pure hypercholesterolemia, unspecified: Secondary | ICD-10-CM
